# Patient Record
Sex: Female | Born: 1965
Health system: Southern US, Community
[De-identification: ages and names within clinical notes are randomized; demographics above are authoritative.]

## PROBLEM LIST (undated history)

## (undated) DIAGNOSIS — L570 Actinic keratosis: Secondary | ICD-10-CM

## (undated) DIAGNOSIS — K589 Irritable bowel syndrome without diarrhea: Secondary | ICD-10-CM

## (undated) DIAGNOSIS — E079 Disorder of thyroid, unspecified: Secondary | ICD-10-CM

## (undated) DIAGNOSIS — Z90710 Acquired absence of both cervix and uterus: Secondary | ICD-10-CM

## (undated) DIAGNOSIS — K5730 Diverticulosis of large intestine without perforation or abscess without bleeding: Secondary | ICD-10-CM

## (undated) DIAGNOSIS — R87613 High grade squamous intraepithelial lesion on cytologic smear of cervix (HGSIL): Secondary | ICD-10-CM

## (undated) HISTORY — DX: Acquired absence of both cervix and uterus: Z90.710

## (undated) HISTORY — DX: Diverticulosis of large intestine without perforation or abscess without bleeding: K57.30

## (undated) HISTORY — DX: Irritable bowel syndrome, unspecified: K58.9

## (undated) HISTORY — DX: High grade squamous intraepithelial lesion on cytologic smear of cervix (HGSIL): R87.613

---
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
## 1993-10-12 HISTORY — PX: APPENDECTOMY: SHX54

## 2000-07-04 HISTORY — PX: TOTAL VAGINAL HYSTERECTOMY: SHX2548

## 2001-07-04 DIAGNOSIS — R87613 High grade squamous intraepithelial lesion on cytologic smear of cervix (HGSIL): Secondary | ICD-10-CM

## 2001-07-04 HISTORY — PX: BREAST SURGERY: SHX581

## 2001-07-04 HISTORY — DX: High grade squamous intraepithelial lesion on cytologic smear of cervix (HGSIL): R87.613

## 2002-03-26 HISTORY — PX: CERVICAL BIOPSY  W/ LOOP ELECTRODE EXCISION: SUR135

## 2002-07-04 DIAGNOSIS — Z90710 Acquired absence of both cervix and uterus: Secondary | ICD-10-CM

## 2002-07-04 HISTORY — DX: Acquired absence of both cervix and uterus: Z90.710

## 2004-07-09 ENCOUNTER — Ambulatory Visit: Payer: Self-pay

## 2006-04-25 ENCOUNTER — Ambulatory Visit: Payer: Self-pay

## 2007-08-15 ENCOUNTER — Ambulatory Visit: Payer: Self-pay | Admitting: General Practice

## 2008-08-19 ENCOUNTER — Ambulatory Visit: Payer: Self-pay

## 2009-08-20 ENCOUNTER — Ambulatory Visit: Payer: Self-pay

## 2010-07-16 ENCOUNTER — Ambulatory Visit: Payer: Self-pay | Admitting: General Practice

## 2010-07-26 ENCOUNTER — Ambulatory Visit: Payer: Self-pay | Admitting: General Practice

## 2010-08-24 ENCOUNTER — Ambulatory Visit: Payer: Self-pay

## 2011-09-07 ENCOUNTER — Ambulatory Visit: Payer: Self-pay | Admitting: General Practice

## 2011-09-12 ENCOUNTER — Ambulatory Visit: Payer: Self-pay | Admitting: General Practice

## 2012-09-12 ENCOUNTER — Ambulatory Visit: Payer: Self-pay

## 2012-09-12 LAB — HM MAMMOGRAPHY

## 2013-07-04 DIAGNOSIS — K5730 Diverticulosis of large intestine without perforation or abscess without bleeding: Secondary | ICD-10-CM

## 2013-07-04 HISTORY — DX: Diverticulosis of large intestine without perforation or abscess without bleeding: K57.30

## 2013-07-04 HISTORY — PX: COLONOSCOPY: SHX174

## 2013-09-17 ENCOUNTER — Ambulatory Visit: Payer: Self-pay | Admitting: Gastroenterology

## 2013-09-17 DIAGNOSIS — K8020 Calculus of gallbladder without cholecystitis without obstruction: Secondary | ICD-10-CM | POA: Insufficient documentation

## 2013-10-07 ENCOUNTER — Ambulatory Visit (INDEPENDENT_AMBULATORY_CARE_PROVIDER_SITE_OTHER): Payer: BC Managed Care – PPO | Admitting: General Surgery

## 2013-10-07 ENCOUNTER — Encounter: Payer: Self-pay | Admitting: General Surgery

## 2013-10-07 VITALS — BP 118/80 | HR 68 | Resp 14 | Ht 62.5 in | Wt 120.0 lb

## 2013-10-07 DIAGNOSIS — K8020 Calculus of gallbladder without cholecystitis without obstruction: Secondary | ICD-10-CM

## 2013-10-07 DIAGNOSIS — R1032 Left lower quadrant pain: Secondary | ICD-10-CM

## 2013-10-07 NOTE — Progress Notes (Signed)
Patient ID: Tiffany Miller, female   DOB: [birth year], 48 y.o.   MRN: [id number]  Chief Complaint  Patient presents with  . Other    evaluation of gallbladder    HPI Tiffany Miller is a 48 y.o. female who presents for an evaluation of her gallbladder. The patient states she started having problems with her gallbladder around Thanksgiving. She complains of constipation and diarrhea, nausea, and low abdominal cramping. She has had 2-3 severe episodes since Thanksgiving. She hasn't had any episodes in the last 2 weeks. She states that she has identified salads to be the triggering event on 2 occasions of her left lower quadrant pain. She does have a history of IBS as well, diagnosed by her family physician at age 89 when she reported intermittent abdominal pain and waxing/waning constipation/diarrhea. Barium enema at that time was reported to be negative. The patient reports no dietary intolerance resulting in upper abdominal symptoms, right upper quadrant/right shoulder/epigastric pain. No intolerance to fatty foods. Nausea has been a component on rare occasions when the pain in the lower abdomen has been severe.  The patient had an abdominal ultrasound on 09/17/13. The patient had a colonoscopy with Dr. Rayann Heman last week which time multiple diverticuli were reported.   The patient was last seen here in March 2013 in regards to a questionable mammogram. She has continued to have annual exams through the city [employer].  HPI  Past Medical History  Diagnosis Date  . IBS (irritable bowel syndrome)   . Diverticula, colon 2015    Past Surgical History  Procedure Laterality Date  . Tonsillectomy  1975  . Appendectomy  1995  . Breast surgery Bilateral 2003    augmentation  . Abdominal hysterectomy    . Colonoscopy  2015    Dr. Rayann Heman    Family History  Problem Relation Age of Onset  . Cancer Father     unknown  . Cancer Maternal Grandmother     breast    Social History History   Substance Use Topics  . Smoking status: Never Smoker   . Smokeless tobacco: Never Used  . Alcohol Use: No    Allergies  Allergen Reactions  . Sulfa Antibiotics Hives and Rash    Current Outpatient Prescriptions  Medication Sig Dispense Refill  . hyoscyamine (LEVBID) 0.375 MG 12 hr tablet Take 1 tablet by mouth as needed.       No current facility-administered medications for this visit.    Review of Systems Review of Systems  Constitutional: Negative.   Respiratory: Negative.   Cardiovascular: Negative.   Gastrointestinal: Positive for nausea, abdominal pain and constipation.    Blood pressure 118/80, pulse 68, resp. rate 14, height 5' 2.5" (1.588 m), weight 120 lb (54.432 kg).  Physical Exam Physical Exam  Constitutional: She is oriented to person, place, and time. She appears well-developed and well-nourished.  Neck: Neck supple. No thyromegaly present.  Cardiovascular: Normal rate, regular rhythm and normal heart sounds.   No murmur heard. Pulmonary/Chest: Effort normal and breath sounds normal.  Abdominal: Soft. Normal appearance and bowel sounds are normal. There is no hepatosplenomegaly. There is no tenderness. No hernia.  Lymphadenopathy:    She has no cervical adenopathy.  Neurological: She is alert and oriented to person, place, and time.  Skin: Skin is warm and dry.    Data Reviewed Abdominal ultrasounds from January 2012 and March 2015 were reviewed. Interval development of a few small 2 mm mobile lesions thought to represent  gallstones. No evidence of gallbladder wall thickening, pericholecystic fluid or Murphy sign. No additional intra-abdominal pathology reported.  Assessment    Intermittent abdominal pain, left lower quadrant. Unlikely correlation with recently identified gallstones.    Plan    It was not possible to reviewed the colonoscopy report, as numbness walk into the hospital system. The patient's reports of left lower quadrant pain as well  as a long history of waxing and waning bowel habits would speak more to IBS/diverticular source for her pain. I think this will likely to do expect resolution of her abdominal symptoms with elective cholecystectomy based on her present history and exam.  High fiber diet and adequate fluid intake to allow soft lamination was encouraged. Should she develop upper abdominal symptoms we can reassess the appropriateness of elective cholecystectomy.    PCP: Nelda Severe. Burt Ek MD Ref. MD: Arther Dames MD   Robert Bellow 10/08/2013, 8:27 AM

## 2013-10-07 NOTE — Patient Instructions (Addendum)
Patient to return as needed. The patient is aware to call back for any questions or concerns. Patient advised to add a fiber supplement to her diet.

## 2013-10-08 DIAGNOSIS — R1032 Left lower quadrant pain: Secondary | ICD-10-CM | POA: Insufficient documentation

## 2014-02-10 ENCOUNTER — Ambulatory Visit: Payer: Self-pay | Admitting: General Practice

## 2014-04-22 ENCOUNTER — Ambulatory Visit: Payer: Self-pay | Admitting: General Practice

## 2014-05-05 ENCOUNTER — Encounter: Payer: Self-pay | Admitting: General Surgery

## 2014-12-24 ENCOUNTER — Other Ambulatory Visit: Payer: Self-pay | Admitting: Physician Assistant

## 2014-12-24 DIAGNOSIS — E0590 Thyrotoxicosis, unspecified without thyrotoxic crisis or storm: Secondary | ICD-10-CM

## 2014-12-26 ENCOUNTER — Ambulatory Visit
Admission: RE | Admit: 2014-12-26 | Discharge: 2014-12-26 | Disposition: A | Discharge status: Home / Self Care | Payer: BLUE CROSS/BLUE SHIELD | Source: Ambulatory Visit | Attending: Physician Assistant | Admitting: Physician Assistant

## 2014-12-26 DIAGNOSIS — E0590 Thyrotoxicosis, unspecified without thyrotoxic crisis or storm: Secondary | ICD-10-CM | POA: Diagnosis present

## 2014-12-26 DIAGNOSIS — E041 Nontoxic single thyroid nodule: Secondary | ICD-10-CM | POA: Diagnosis not present

## 2015-02-05 IMAGING — US ABDOMEN ULTRASOUND
1 series · 14 of 25 positions shown · non-contrast
Comparison: July 26, 2010

CLINICAL DATA: Abdominal pain

EXAM:
ULTRASOUND ABDOMEN COMPLETE

[Series 1: abdomen ultrasound · 0.20mm/px · 14 of 98 slices shown]
[im 1/98]
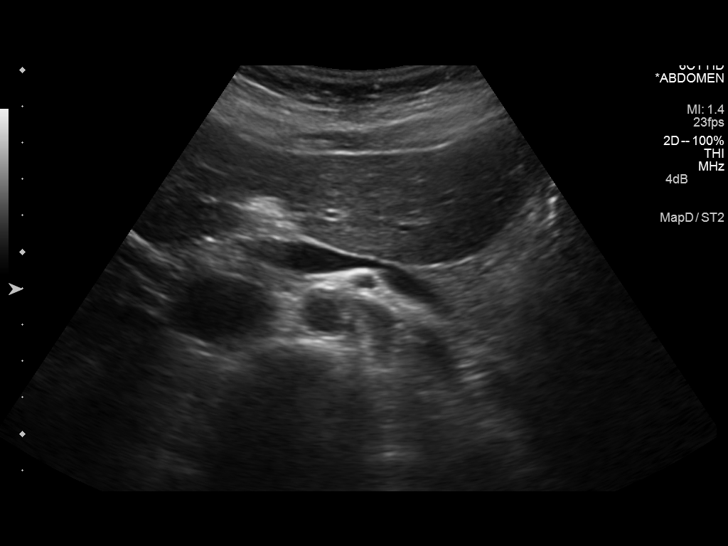
[im 9/98]
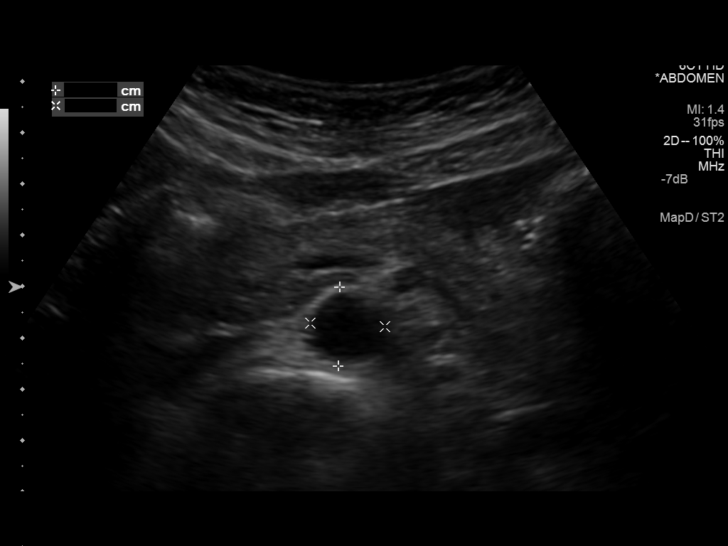
[im 17/98]
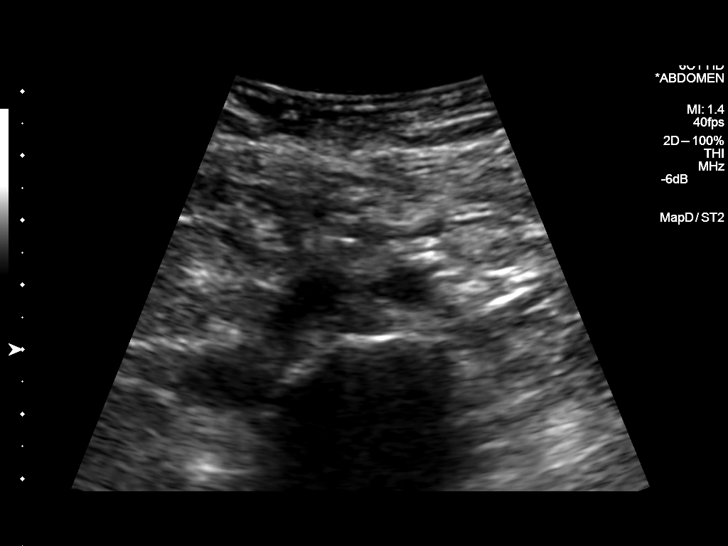
[im 25/98]
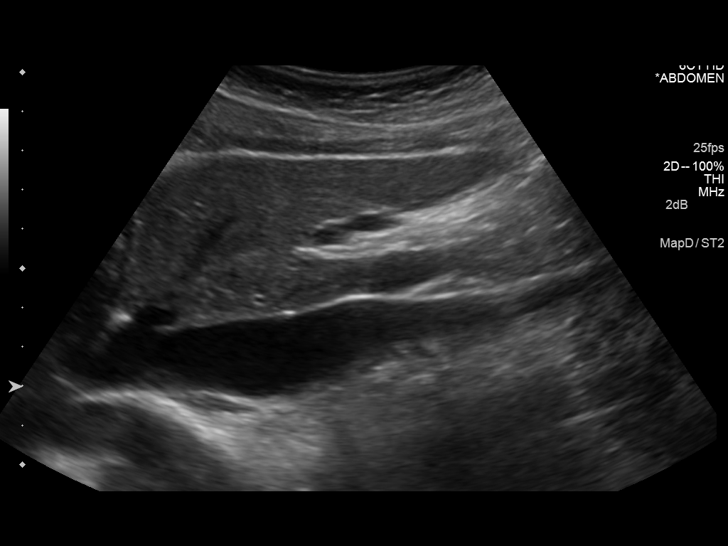
[im 33/98]
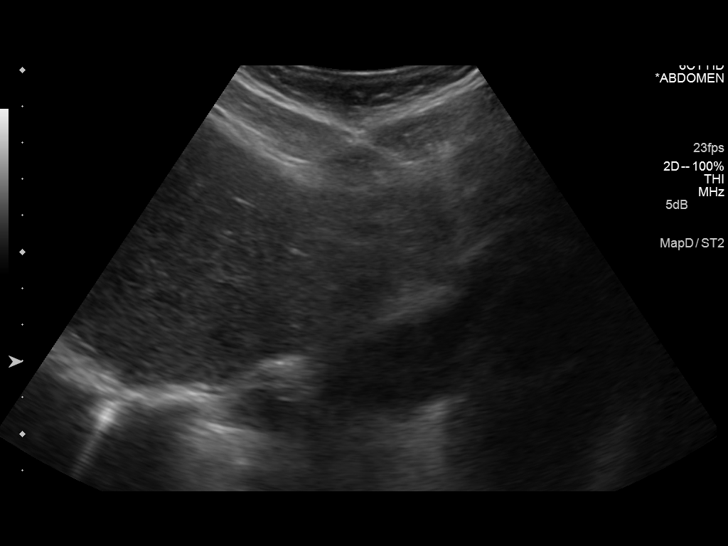
[im 37/98]
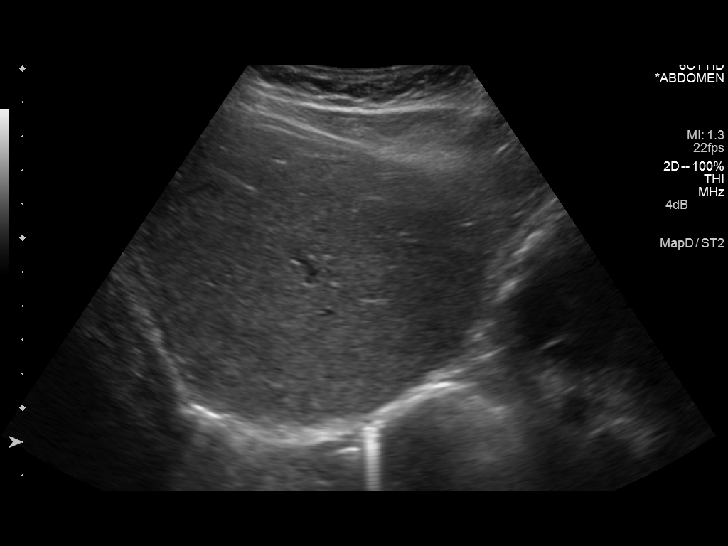
[im 45/98]
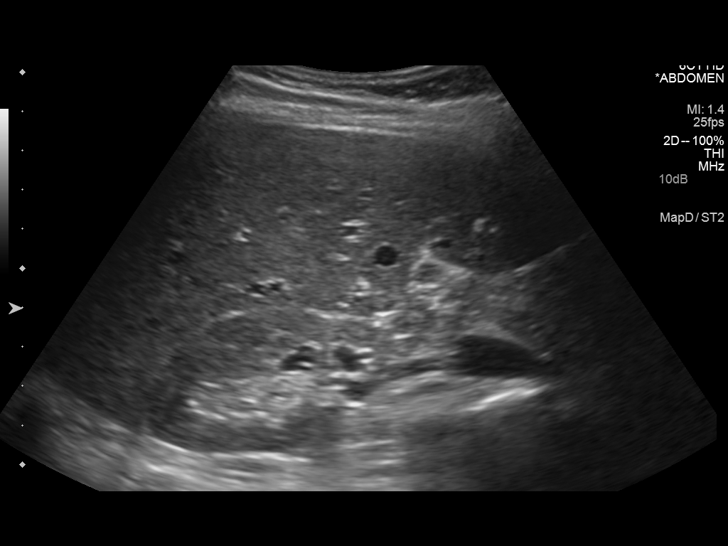
[im 53/98]
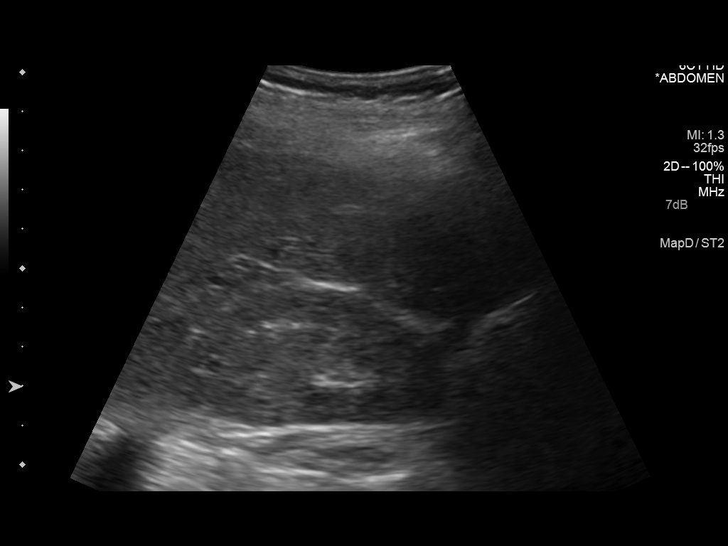
[im 61/98]
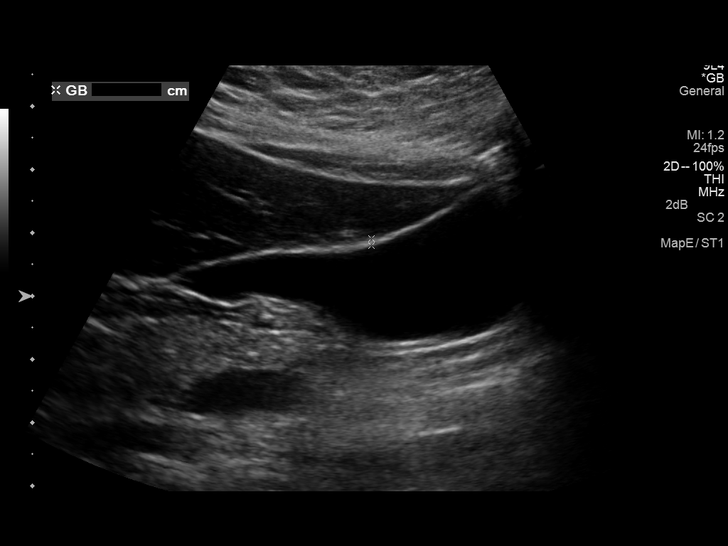
[im 65/98]
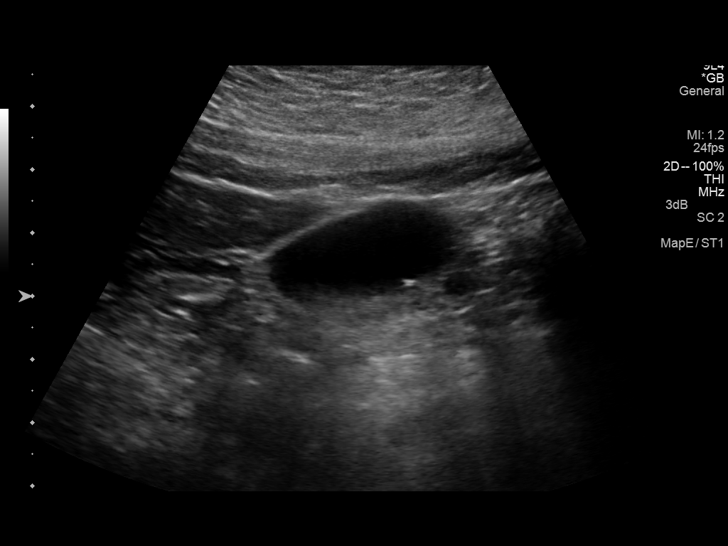
[im 73/98]
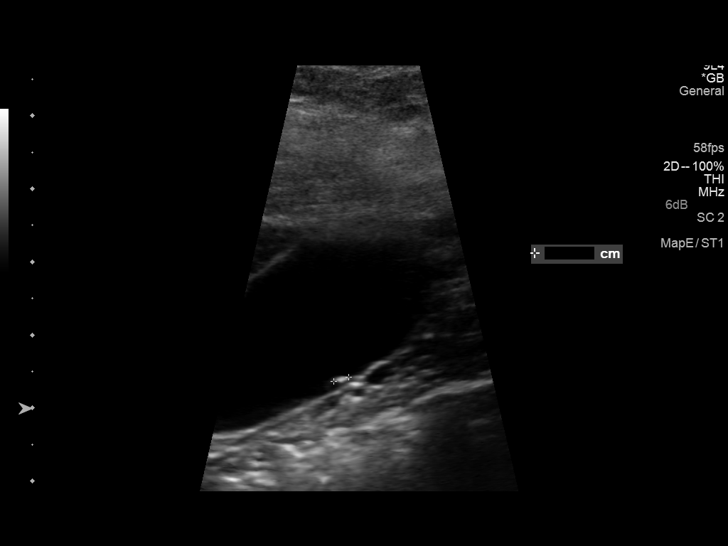
[im 81/98]
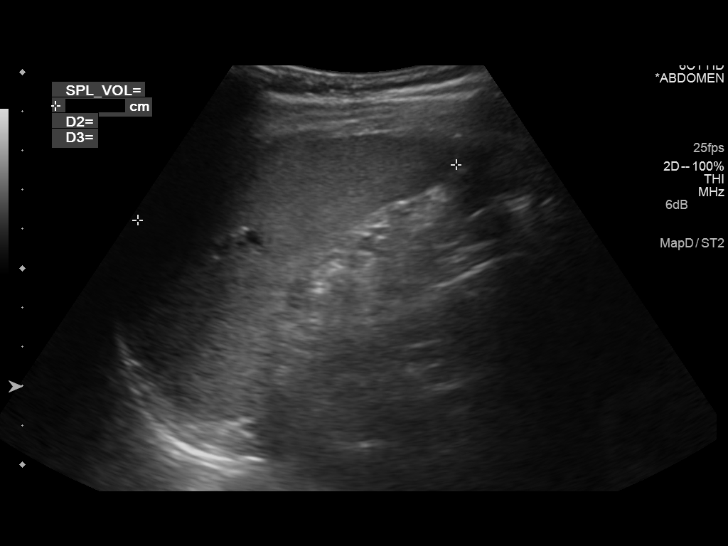
[im 89/98]
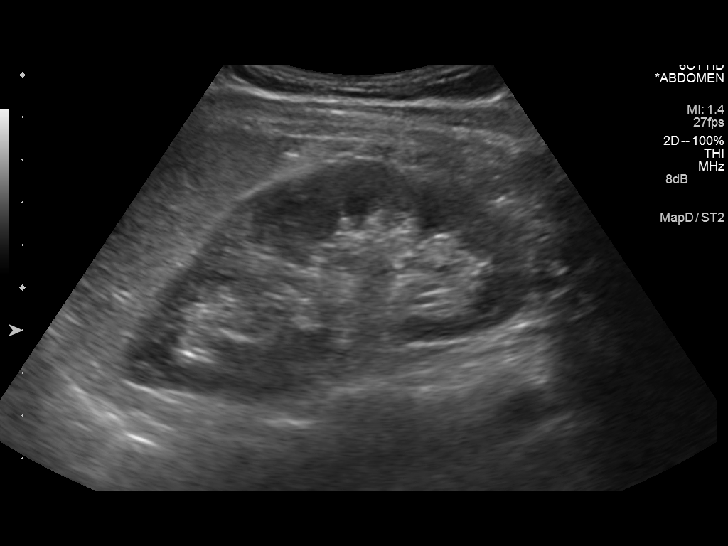
[im 98/98]
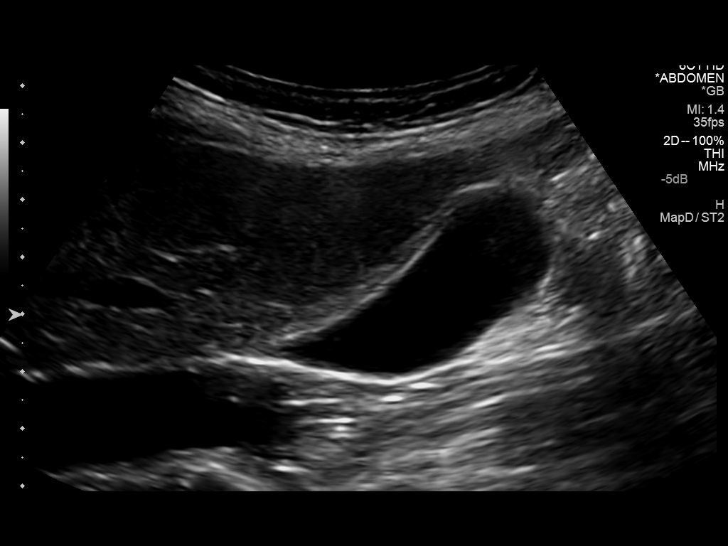

[14 of 25 positions shown; findings below may reference images not displayed]

FINDINGS: Gallbladder:

Within the gallbladder, there are several small echogenic foci which
move and shadow consistent with gallstones. There is no gallbladder
wall thickening or pericholecystic fluid. No sonographic Murphy sign
noted.

Common bile duct:

Diameter: 2 mm. There is no intrahepatic, common hepatic, or common
bile duct dilatation.

Liver:

No focal lesion identified. Within normal limits in parenchymal
echogenicity.

IVC:

No abnormality visualized.

Pancreas:

Visualized portion unremarkable. Portions of pancreas are obscured
by gas.

Spleen:

Size and appearance within normal limits.

Right Kidney:

Length: 9.5 cm. Echogenicity within normal limits. No mass or
hydronephrosis visualized.

Left Kidney:

Length: 10.1 cm. Echogenicity within normal limits. No mass or
hydronephrosis visualized.

Abdominal aorta:

No aneurysm visualized.

Other findings:

No demonstrable ascites.
IMPRESSION: Cholelithiasis. Portions of pancreas obscured by gas. Visualized
portions of pancreas appear normal. Study otherwise unremarkable.

## 2015-03-02 LAB — HM PAP SMEAR

## 2015-05-20 ENCOUNTER — Encounter: Payer: Self-pay | Admitting: Emergency Medicine

## 2015-05-20 ENCOUNTER — Emergency Department: Payer: BLUE CROSS/BLUE SHIELD

## 2015-05-20 ENCOUNTER — Emergency Department
Admission: EM | Admit: 2015-05-20 | Discharge: 2015-05-20 | Disposition: A | Discharge status: Home / Self Care | Payer: BLUE CROSS/BLUE SHIELD | Attending: Emergency Medicine | Admitting: Emergency Medicine

## 2015-05-20 DIAGNOSIS — E039 Hypothyroidism, unspecified: Secondary | ICD-10-CM | POA: Diagnosis not present

## 2015-05-20 DIAGNOSIS — X58XXXA Exposure to other specified factors, initial encounter: Secondary | ICD-10-CM | POA: Diagnosis not present

## 2015-05-20 DIAGNOSIS — S3992XA Unspecified injury of lower back, initial encounter: Secondary | ICD-10-CM | POA: Diagnosis present

## 2015-05-20 DIAGNOSIS — Z79899 Other long term (current) drug therapy: Secondary | ICD-10-CM | POA: Diagnosis not present

## 2015-05-20 DIAGNOSIS — Y998 Other external cause status: Secondary | ICD-10-CM | POA: Diagnosis not present

## 2015-05-20 DIAGNOSIS — Y9289 Other specified places as the place of occurrence of the external cause: Secondary | ICD-10-CM | POA: Insufficient documentation

## 2015-05-20 DIAGNOSIS — Y93B9 Activity, other involving muscle strengthening exercises: Secondary | ICD-10-CM | POA: Insufficient documentation

## 2015-05-20 DIAGNOSIS — S39012A Strain of muscle, fascia and tendon of lower back, initial encounter: Secondary | ICD-10-CM | POA: Insufficient documentation

## 2015-05-20 DIAGNOSIS — K5901 Slow transit constipation: Secondary | ICD-10-CM | POA: Insufficient documentation

## 2015-05-20 HISTORY — DX: Disorder of thyroid, unspecified: E07.9

## 2015-05-20 MED ORDER — METHOCARBAMOL 750 MG PO TABS
750.0000 mg | ORAL_TABLET | Freq: Four times a day (QID) | ORAL | Status: DC
Start: 2015-05-20 — End: 2017-01-11

## 2015-05-20 MED ORDER — HYDROMORPHONE HCL 1 MG/ML IJ SOLN
1.0000 mg | Freq: Once | INTRAMUSCULAR | Status: AC
  Administered 2015-05-20: 1 mg via INTRAMUSCULAR
  Filled 2015-05-20: qty 1

## 2015-05-20 MED ORDER — ORPHENADRINE CITRATE 30 MG/ML IJ SOLN
60.0000 mg | Freq: Two times a day (BID) | INTRAMUSCULAR | Status: DC
  Administered 2015-05-20: 60 mg via INTRAMUSCULAR
  Filled 2015-05-20: qty 2

## 2015-05-20 MED ORDER — OXYCODONE-ACETAMINOPHEN 7.5-325 MG PO TABS: 1.0000 | ORAL_TABLET | ORAL | Status: DC | PRN

## 2015-05-20 MED ORDER — HYDROCODONE-ACETAMINOPHEN 7.5-300 MG PO TABS: 1.0000 | ORAL_TABLET | Freq: Four times a day (QID) | ORAL | Status: DC

## 2015-05-20 MED ORDER — METHOCARBAMOL 750 MG PO TABS: 1500.0000 mg | ORAL_TABLET | Freq: Four times a day (QID) | ORAL | Status: DC

## 2015-05-20 NOTE — ED Notes (Signed)
Pt to ER with c/o low back pain (just above tailbone).  Denies known injury, denies urinary symptoms.  States went to gym yesterday and started about 2 hours after that. States not first time at gym.  States pain with standing, sitting and movement.

## 2015-05-20 NOTE — ED Notes (Signed)
Pt states last night she had sudden onset of lower back pain, denies any injury or previous back pain hx, pt unable to ambulate without assistance, states pain when legs are raised, not tender to palpation

## 2015-05-20 NOTE — ED Provider Notes (Signed)
[facility] Emergency Department Provider Note  ____________________________________________  Time seen: Approximately 4:39 PM  I have reviewed the triage vital signs and the nursing notes.   HISTORY  Chief Complaint Back Pain    HPI Tiffany Miller is a 49 y.o. female patient complaining of onset of acute low back pain just above her tailbone started last night. Patient state she went to the gym yesterday and about 2 hours after exercising she started experiencing low back pain and coccyx area. Patient state the pain increases with standing and sitting. She denies any radicular component to this pain. Patient denies any bladder or bowel dysfunction. No palliative measures taken for this complaint. Patient rates the pain as a 10 over 10 described as sharp. Patient has no prior history of back complaints.   Past Medical History  Diagnosis Date  . IBS (irritable bowel syndrome)   . Diverticula, colon 2015  . Thyroid disease     Patient Active Problem List   Diagnosis Date Noted  . Abdominal pain, left lower quadrant 10/08/2013  . Gallstones 09/17/2013    Past Surgical History  Procedure Laterality Date  . Tonsillectomy  1975  . Appendectomy  1995  . Breast surgery Bilateral 2003    augmentation  . Abdominal hysterectomy    . Colonoscopy  2015    Dr. Rayann Heman    Current Outpatient Rx  Name  Route  Sig  Dispense  Refill  . levothyroxine (SYNTHROID, LEVOTHROID) 50 MCG tablet   Oral   Take 50 mcg by mouth daily before breakfast.         . hyoscyamine (LEVBID) 0.375 MG 12 hr tablet   Oral   Take 1 tablet by mouth as needed.         . methocarbamol (ROBAXIN-750) 750 MG tablet   Oral   Take 2 tablets (1,500 mg total) by mouth 4 (four) times daily.   40 tablet   0   . oxyCODONE-acetaminophen (PERCOCET) 7.5-325 MG tablet   Oral   Take 1 tablet by mouth every 4 (four) hours as needed for severe pain.   20 tablet   0      Allergies Sulfa antibiotics  Family History  Problem Relation Age of Onset  . Cancer Father     unknown  . Cancer Maternal Grandmother     breast    Social History Social History  Substance Use Topics  . Smoking status: Never Smoker   . Smokeless tobacco: Never Used  . Alcohol Use: No    Review of Systems Constitutional: No fever/chills Eyes: No visual changes. ENT: No sore throat. Cardiovascular: Denies chest pain. Respiratory: Denies shortness of breath. Gastrointestinal: No abdominal pain.  No nausea, no vomiting.  No diarrhea.  No constipation. Genitourinary: Negative for dysuria. Musculoskeletal: Positive for back pain. Skin: Negative for rash. Neurological: Negative for headaches, focal weakness or numbness. Endocrine:Hypothyroidism Allergic/Immunilogical: Sulfa antibiotics  10-point ROS otherwise negative.  ____________________________________________   PHYSICAL EXAM:  VITAL SIGNS: ED Triage Vitals  Enc Vitals Group     BP 05/20/15 1610 108/49 mmHg     Pulse Rate 05/20/15 1610 65     Resp 05/20/15 1610 18     Temp 05/20/15 1610 98.3 F (36.8 C)     Temp Source 05/20/15 1610 Oral     SpO2 05/20/15 1610 100 %     Weight 05/20/15 1610 124 lb (56.246 kg)     Height 05/20/15 1610 5\' 2"  (1.575 m)  Head Cir --      Peak Flow --      Pain Score 05/20/15 1615 10     Pain Loc --      Pain Edu? --      Excl. in [place]? --     Constitutional: Alert and oriented. Well appearing and in no acute distress. Eyes: Conjunctivae are normal. PERRL. EOMI. Head: Atraumatic. Nose: No congestion/rhinnorhea. Mouth/Throat: Mucous membranes are moist.  Oropharynx non-erythematous. Neck: No stridor.  No cervical spine tenderness to palpation. Hematological/Lymphatic/Immunilogical: No cervical lymphadenopathy. Cardiovascular: Normal rate, regular rhythm. Grossly normal heart sounds.  Good peripheral circulation. Respiratory: Normal respiratory effort.  No  retractions. Lungs CTAB. Gastrointestinal: Soft and nontender. No distention. No abdominal bruits. No CVA tenderness. Musculoskeletal: No spinal deformity. Patient sits and stands with assistance. Patient decreased range of motion with extension. There was no guarding palpation spinal processes. Patient straight leg raise tests positive for left lower extremity.  Neurologic:  Normal speech and language. No gross focal neurologic deficits are appreciated. No gait instability. Skin:  Skin is warm, dry and intact. No rash noted. Psychiatric: Mood and affect are normal. Speech and behavior are normal.  ____________________________________________   LABS (all labs ordered are listed, but only abnormal results are displayed)  Labs Reviewed - No data to display ____________________________________________  EKG   ____________________________________________  RADIOLOGY  No acute findings of lumbar x-ray. Incidental finding of heavy stool burden throughout the colon. ____________________________________________   PROCEDURES  Procedure(s) performed: None  Critical Care performed: No  ____________________________________________   INITIAL IMPRESSION / ASSESSMENT AND PLAN / ED COURSE  Pertinent labs & imaging results that were available during my care of the patient were reviewed by me and considered in my medical decision making (see chart for details). Acute lumbar strain. Discussed x-ray findings with patient. Patient given a prescription for Percocet and Robaxin. Patient given discharge care instructions and a work note for 2 days. Patient advised to follow-up with Kennon Rounds for reevaluation before returning to work. ____________________________________________   FINAL CLINICAL IMPRESSION(S) / ED DIAGNOSES  Final diagnoses:  Lumbar strain, initial encounter  Constipation by delayed colonic transit      Sable Feil, PA-C 05/20/15 [place],  MD 05/20/15 2049

## 2015-10-14 DIAGNOSIS — Z9882 Breast implant status: Secondary | ICD-10-CM | POA: Diagnosis not present

## 2015-10-14 DIAGNOSIS — N649 Disorder of breast, unspecified: Secondary | ICD-10-CM | POA: Diagnosis not present

## 2015-12-28 DIAGNOSIS — L812 Freckles: Secondary | ICD-10-CM | POA: Diagnosis not present

## 2015-12-28 DIAGNOSIS — D229 Melanocytic nevi, unspecified: Secondary | ICD-10-CM | POA: Diagnosis not present

## 2015-12-28 DIAGNOSIS — L570 Actinic keratosis: Secondary | ICD-10-CM | POA: Diagnosis not present

## 2015-12-28 DIAGNOSIS — L578 Other skin changes due to chronic exposure to nonionizing radiation: Secondary | ICD-10-CM | POA: Diagnosis not present

## 2015-12-28 DIAGNOSIS — D485 Neoplasm of uncertain behavior of skin: Secondary | ICD-10-CM | POA: Diagnosis not present

## 2015-12-28 DIAGNOSIS — Z1283 Encounter for screening for malignant neoplasm of skin: Secondary | ICD-10-CM | POA: Diagnosis not present

## 2015-12-28 HISTORY — DX: Actinic keratosis: L57.0

## 2016-01-26 DIAGNOSIS — L570 Actinic keratosis: Secondary | ICD-10-CM | POA: Diagnosis not present

## 2016-01-26 DIAGNOSIS — L819 Disorder of pigmentation, unspecified: Secondary | ICD-10-CM | POA: Diagnosis not present

## 2016-01-26 DIAGNOSIS — L821 Other seborrheic keratosis: Secondary | ICD-10-CM | POA: Diagnosis not present

## 2016-01-26 DIAGNOSIS — L812 Freckles: Secondary | ICD-10-CM | POA: Diagnosis not present

## 2016-01-26 DIAGNOSIS — L99 Other disorders of skin and subcutaneous tissue in diseases classified elsewhere: Secondary | ICD-10-CM | POA: Diagnosis not present

## 2016-05-04 DIAGNOSIS — E041 Nontoxic single thyroid nodule: Secondary | ICD-10-CM | POA: Diagnosis not present

## 2016-05-04 DIAGNOSIS — M542 Cervicalgia: Secondary | ICD-10-CM | POA: Diagnosis not present

## 2016-05-04 DIAGNOSIS — R1312 Dysphagia, oropharyngeal phase: Secondary | ICD-10-CM | POA: Diagnosis not present

## 2016-05-04 DIAGNOSIS — E063 Autoimmune thyroiditis: Secondary | ICD-10-CM | POA: Diagnosis not present

## 2016-05-30 DIAGNOSIS — R490 Dysphonia: Secondary | ICD-10-CM | POA: Diagnosis not present

## 2016-05-30 DIAGNOSIS — R1314 Dysphagia, pharyngoesophageal phase: Secondary | ICD-10-CM | POA: Diagnosis not present

## 2016-05-30 DIAGNOSIS — F410 Panic disorder [episodic paroxysmal anxiety] without agoraphobia: Secondary | ICD-10-CM | POA: Diagnosis not present

## 2016-06-03 DIAGNOSIS — F411 Generalized anxiety disorder: Secondary | ICD-10-CM | POA: Diagnosis not present

## 2016-06-14 DIAGNOSIS — F411 Generalized anxiety disorder: Secondary | ICD-10-CM | POA: Diagnosis not present

## 2016-06-22 DIAGNOSIS — F411 Generalized anxiety disorder: Secondary | ICD-10-CM | POA: Diagnosis not present

## 2016-10-07 IMAGING — CR DG LUMBAR SPINE COMPLETE 4+V
1 series · 5 of 5 positions shown · non-contrast
Comparison: None.

CLINICAL DATA: Acute onset low back pain without known injury.
Initial encounter.

EXAM:
LUMBAR SPINE - COMPLETE 4+ VIEW

[Series 1: dg lumbar spine complete 4 +v · 0.14mm/px · 5 of 5 slices shown]
[im 1/5]
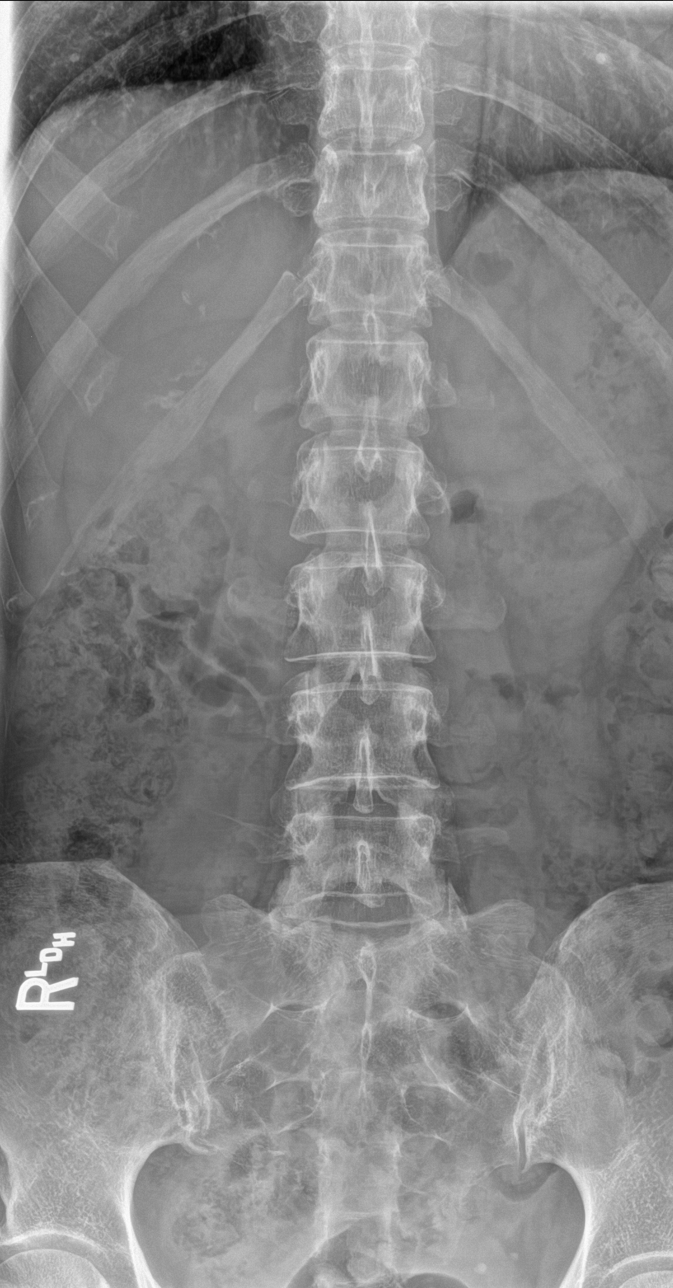
[im 2/5]
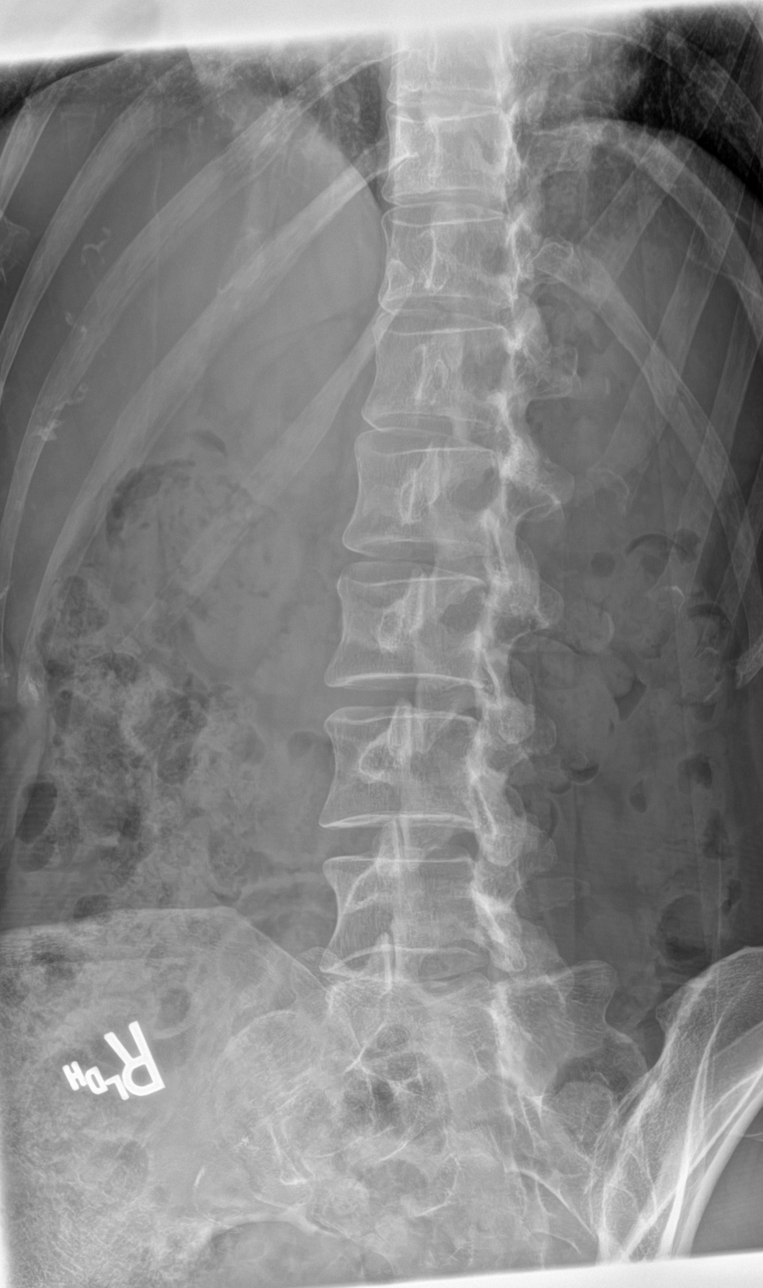
[im 3/5]
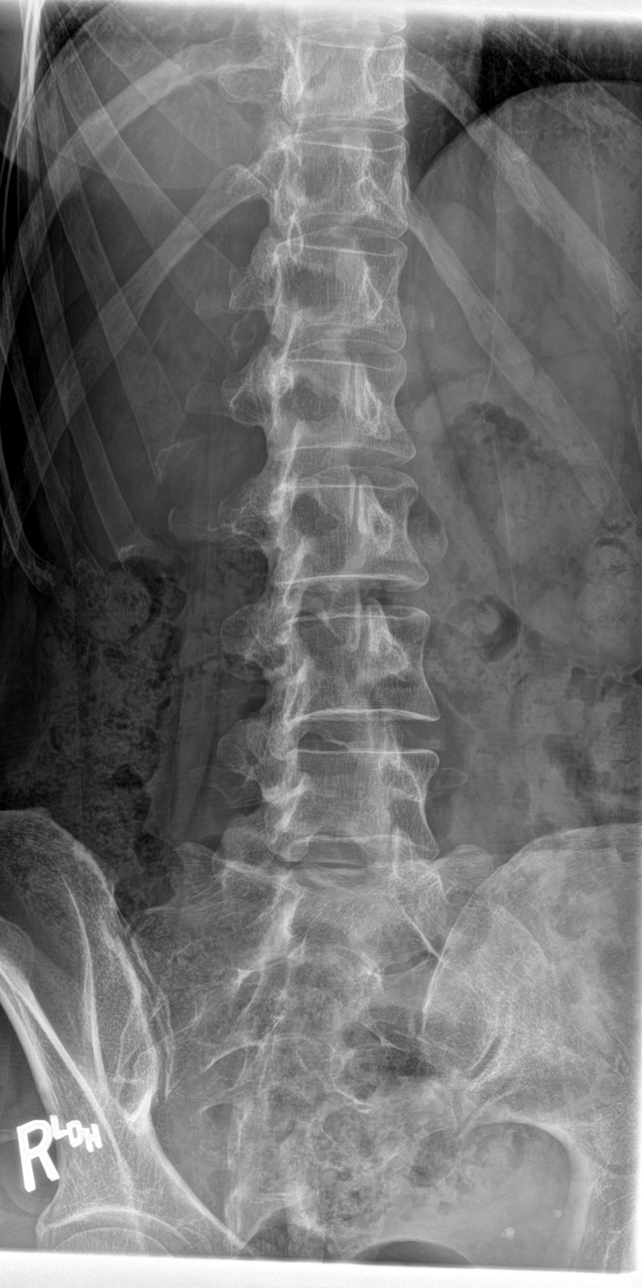
[im 4/5]
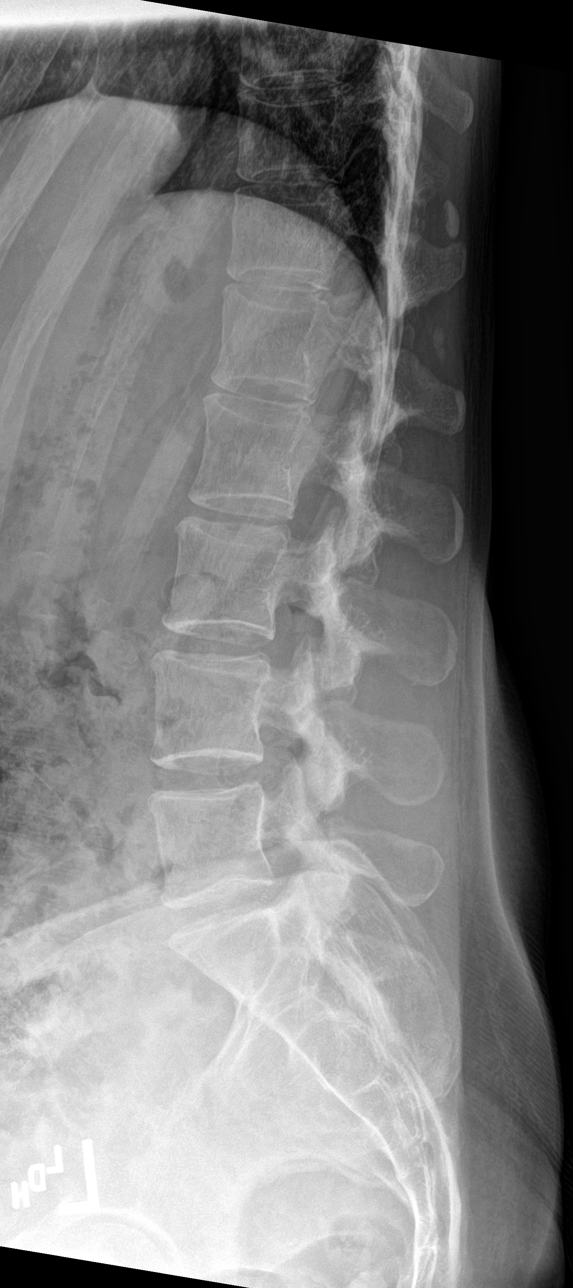
[im 5/5]
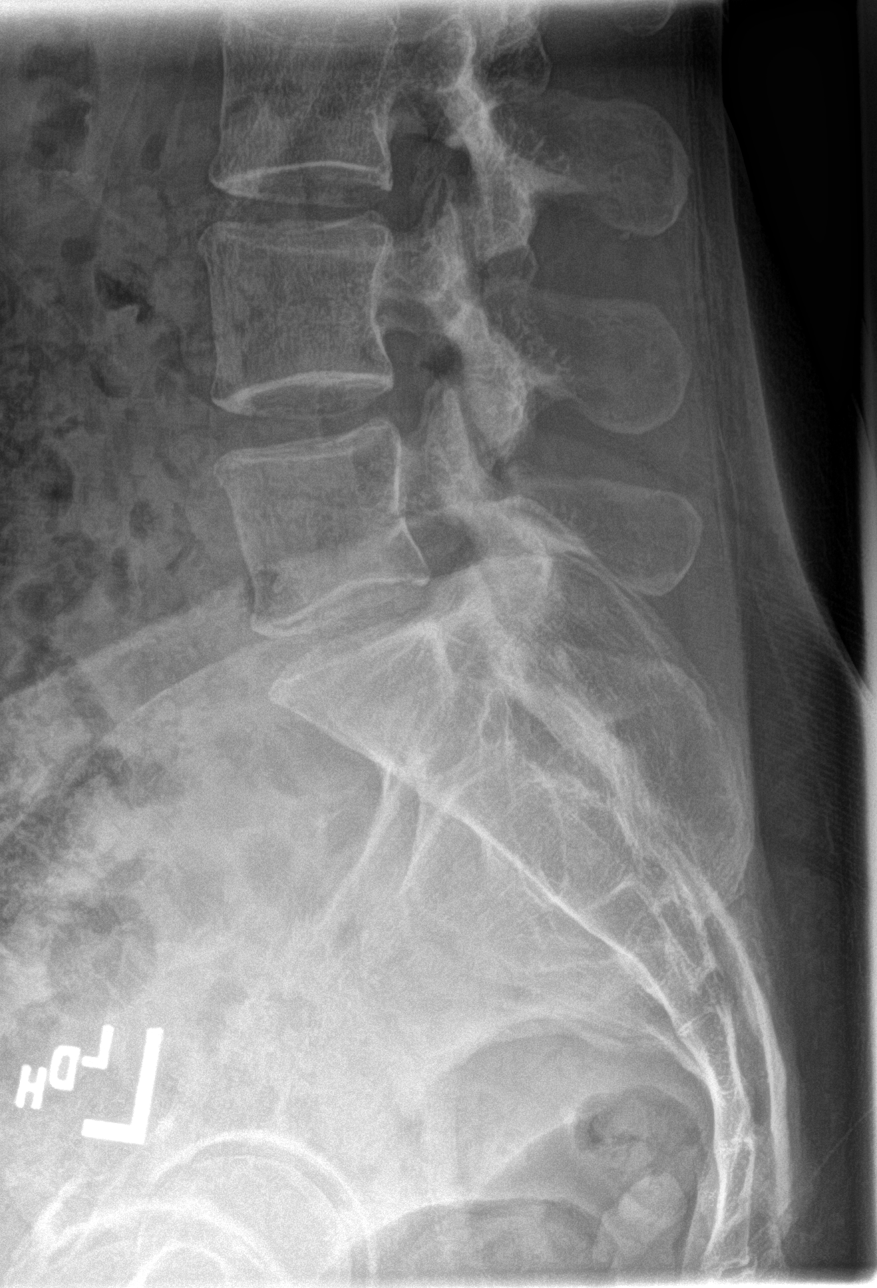

[5 of 5 positions shown; findings below may reference images not displayed]

FINDINGS: There is no evidence of lumbar spine fracture. Alignment is normal.
Intervertebral disc spaces are maintained. Large stool burden
throughout the colon is noted.
IMPRESSION: Normal appearing lumbar spine.

Large colonic stool burden.

## 2016-10-14 DIAGNOSIS — Z1231 Encounter for screening mammogram for malignant neoplasm of breast: Secondary | ICD-10-CM | POA: Diagnosis not present

## 2016-10-14 DIAGNOSIS — Z9882 Breast implant status: Secondary | ICD-10-CM | POA: Diagnosis not present

## 2016-11-22 DIAGNOSIS — H524 Presbyopia: Secondary | ICD-10-CM | POA: Diagnosis not present

## 2017-01-09 DIAGNOSIS — L821 Other seborrheic keratosis: Secondary | ICD-10-CM | POA: Diagnosis not present

## 2017-01-09 DIAGNOSIS — L812 Freckles: Secondary | ICD-10-CM | POA: Diagnosis not present

## 2017-01-09 DIAGNOSIS — Z1283 Encounter for screening for malignant neoplasm of skin: Secondary | ICD-10-CM | POA: Diagnosis not present

## 2017-01-09 DIAGNOSIS — D2371 Other benign neoplasm of skin of right lower limb, including hip: Secondary | ICD-10-CM | POA: Diagnosis not present

## 2017-01-11 ENCOUNTER — Ambulatory Visit (INDEPENDENT_AMBULATORY_CARE_PROVIDER_SITE_OTHER): Payer: BLUE CROSS/BLUE SHIELD | Admitting: Obstetrics and Gynecology

## 2017-01-11 ENCOUNTER — Encounter: Payer: Self-pay | Admitting: Obstetrics and Gynecology

## 2017-01-11 VITALS — BP 104/58 | HR 63 | Ht 62.5 in | Wt 128.0 lb

## 2017-01-11 DIAGNOSIS — S93402A Sprain of unspecified ligament of left ankle, initial encounter: Secondary | ICD-10-CM | POA: Diagnosis not present

## 2017-01-11 DIAGNOSIS — Z01419 Encounter for gynecological examination (general) (routine) without abnormal findings: Secondary | ICD-10-CM

## 2017-01-11 DIAGNOSIS — Z1151 Encounter for screening for human papillomavirus (HPV): Secondary | ICD-10-CM | POA: Diagnosis not present

## 2017-01-11 DIAGNOSIS — Z1231 Encounter for screening mammogram for malignant neoplasm of breast: Secondary | ICD-10-CM

## 2017-01-11 DIAGNOSIS — Z1239 Encounter for other screening for malignant neoplasm of breast: Secondary | ICD-10-CM

## 2017-01-11 DIAGNOSIS — Z124 Encounter for screening for malignant neoplasm of cervix: Secondary | ICD-10-CM | POA: Diagnosis not present

## 2017-01-11 LAB — HM PAP SMEAR: HM Pap smear: NEGATIVE

## 2017-01-11 NOTE — Progress Notes (Signed)
Chief Complaint  Patient presents with  . Gynecologic Exam     HPI:      Ms. Tiffany Miller is a 51 y.o. [phone] who LMP was No LMP recorded. Patient has had a hysterectomy., presents today for her annual examination.  Her menses are absent due to hyst. She does not have intermenstrual bleeding. She has tolerable vasomotor sx.   Sex activity: single partner, contraception - status post hysterectomy.  Last Pap: March 02, 2015  Results were: no abnormalities /neg HPV DNA  She has a hx of abn paps I the past and is s/p LEEP in the past. Hx of STDs: HPV  Last mammogram: at [place] within last yr There is a FH of breast cancer in her MGM. There is no FH of ovarian cancer. The patient does do self-breast exams.  Tobacco use: The patient denies current or previous tobacco use. Alcohol use: social drinker Exercise: not active  She does not get adequate calcium and Vitamin D in her diet.  She has labs with PCP  Past Medical History:  Diagnosis Date  . Diverticula, colon 2015  . Hx of hysterectomy 2004   d/t abnl pap HGSIL  . IBS (irritable bowel syndrome)   . 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 Thyroid disease     Past Surgical History:  Procedure Laterality Date  . ABDOMINAL HYSTERECTOMY  08/29/2002   [facility], MENORRHAGIA, [facility]  . APPENDECTOMY  10/12/1993   LAP  . BREAST SURGERY Bilateral 2003   augmentation  . CERVICAL BIOPSY  W/ LOOP ELECTRODE EXCISION  03/26/2002   SEVERE DYSP  . COLONOSCOPY  2015   Dr. Rayann Heman  . TONSILLECTOMY  1975    Family History  Problem Relation Age of Onset  . Cancer Father        unknown  . Hypertension Mother   . Diabetes Sister        TYPE 2  . Breast cancer Maternal Grandmother     Social History   Social History  . Marital status: Married    Spouse name: N/A  . Number of children: 2  . Years of education: 12   Occupational History  . Not on file.   Social History Main Topics  . Smoking status: Never  Smoker  . Smokeless tobacco: Never Used  . Alcohol use No     Comment: OCC  . Drug use: No  . Sexual activity: Yes    Birth control/ protection: Surgical   Other Topics Concern  . Not on file   Social History Narrative  . No narrative on file     Current Outpatient Prescriptions:  .  levothyroxine (SYNTHROID, LEVOTHROID) 50 MCG tablet, Take 50 mcg by mouth daily before breakfast., Disp: , Rfl:  .  PARoxetine (PAXIL) 10 MG tablet, Take by mouth., Disp: , Rfl:  .  traZODone (DESYREL) 50 MG tablet, Take by mouth., Disp: , Rfl:   ROS:  Review of Systems  Constitutional: Negative for fatigue, fever and unexpected weight change.  Respiratory: Negative for cough, shortness of breath and wheezing.   Cardiovascular: Negative for chest pain, palpitations and leg swelling.  Gastrointestinal: Negative for blood in stool, constipation, diarrhea, nausea and vomiting.  Endocrine: Negative for cold intolerance, heat intolerance and polyuria.  Genitourinary: Negative for dyspareunia, dysuria, flank pain, frequency, genital sores, hematuria, menstrual problem, pelvic pain, urgency, vaginal bleeding, vaginal discharge and vaginal pain.  Musculoskeletal: Negative for back pain, joint swelling and myalgias.  Skin: Negative for rash.  Neurological: Negative for dizziness, syncope, light-headedness, numbness and headaches.  Hematological: Negative for adenopathy.  Psychiatric/Behavioral: Negative for agitation, confusion, sleep disturbance and suicidal ideas. The patient is not nervous/anxious.      Objective: BP (!) 104/58 (BP Location: Left Arm, Patient Position: Sitting, Cuff Size: Normal)   Pulse 63   Ht 5' 2.5" (1.588 m)   Wt 128 lb (58.1 kg)   BMI 23.04 kg/m    Physical Exam  Constitutional: She is oriented to person, place, and time. She appears well-developed and well-nourished.  Genitourinary: Vagina normal. There is no rash or tenderness on the right labia. There is no rash or  tenderness on the left labia. No erythema or tenderness in the vagina. No vaginal discharge found. Right adnexum does not display mass and does not display tenderness. Left adnexum does not display mass and does not display tenderness.  Genitourinary Comments: UTERUS/CX SURG ABSENT  Neck: Normal range of motion. No thyromegaly present.  Cardiovascular: Normal rate, regular rhythm and normal heart sounds.   No murmur heard. Pulmonary/Chest: Effort normal and breath sounds normal. Right breast exhibits no mass, no nipple discharge, no skin change and no tenderness. Left breast exhibits no mass, no nipple discharge, no skin change and no tenderness.  Abdominal: Soft. There is no tenderness. There is no guarding.  Musculoskeletal: Normal range of motion.  Neurological: She is alert and oriented to person, place, and time. No cranial nerve deficit.  Psychiatric: She has a normal mood and affect. Her behavior is normal.  Vitals reviewed.   Assessment/Plan: Encounter for annual routine gynecological examination  Cervical cancer screening - Plan: IGP, Aptima HPV  Screening for HPV (human papillomavirus) - Plan: IGP, Aptima HPV  Screening for breast cancer - Plan: MM DIGITAL SCREENING BILATERAL             GYN counsel mammography screening, adequate intake of calcium and vitamin D, diet and exercise     F/U  Return in about 1 year (around 01/11/2018).  Aarik Blank B. Jeanni Allshouse, PA-C 01/11/2017 9:02 AM

## 2017-01-14 LAB — IGP, APTIMA HPV
HPV Aptima: NEGATIVE
PAP Smear Comment: 0

## 2017-04-03 DIAGNOSIS — H18231 Secondary corneal edema, right eye: Secondary | ICD-10-CM | POA: Diagnosis not present

## 2017-05-08 DIAGNOSIS — E041 Nontoxic single thyroid nodule: Secondary | ICD-10-CM | POA: Diagnosis not present

## 2017-05-08 DIAGNOSIS — E038 Other specified hypothyroidism: Secondary | ICD-10-CM | POA: Diagnosis not present

## 2017-05-08 DIAGNOSIS — E063 Autoimmune thyroiditis: Secondary | ICD-10-CM | POA: Diagnosis not present

## 2017-05-10 DIAGNOSIS — M9902 Segmental and somatic dysfunction of thoracic region: Secondary | ICD-10-CM | POA: Diagnosis not present

## 2017-05-10 DIAGNOSIS — M9901 Segmental and somatic dysfunction of cervical region: Secondary | ICD-10-CM | POA: Diagnosis not present

## 2017-05-10 DIAGNOSIS — M5412 Radiculopathy, cervical region: Secondary | ICD-10-CM | POA: Diagnosis not present

## 2017-05-10 DIAGNOSIS — R51 Headache: Secondary | ICD-10-CM | POA: Diagnosis not present

## 2017-05-15 DIAGNOSIS — R51 Headache: Secondary | ICD-10-CM | POA: Diagnosis not present

## 2017-05-15 DIAGNOSIS — M9902 Segmental and somatic dysfunction of thoracic region: Secondary | ICD-10-CM | POA: Diagnosis not present

## 2017-05-15 DIAGNOSIS — M5412 Radiculopathy, cervical region: Secondary | ICD-10-CM | POA: Diagnosis not present

## 2017-05-15 DIAGNOSIS — M9901 Segmental and somatic dysfunction of cervical region: Secondary | ICD-10-CM | POA: Diagnosis not present

## 2017-05-16 DIAGNOSIS — M5412 Radiculopathy, cervical region: Secondary | ICD-10-CM | POA: Diagnosis not present

## 2017-05-16 DIAGNOSIS — M9902 Segmental and somatic dysfunction of thoracic region: Secondary | ICD-10-CM | POA: Diagnosis not present

## 2017-05-16 DIAGNOSIS — M9901 Segmental and somatic dysfunction of cervical region: Secondary | ICD-10-CM | POA: Diagnosis not present

## 2017-05-16 DIAGNOSIS — R51 Headache: Secondary | ICD-10-CM | POA: Diagnosis not present

## 2017-05-18 DIAGNOSIS — M9902 Segmental and somatic dysfunction of thoracic region: Secondary | ICD-10-CM | POA: Diagnosis not present

## 2017-05-18 DIAGNOSIS — M5412 Radiculopathy, cervical region: Secondary | ICD-10-CM | POA: Diagnosis not present

## 2017-05-18 DIAGNOSIS — M9901 Segmental and somatic dysfunction of cervical region: Secondary | ICD-10-CM | POA: Diagnosis not present

## 2017-05-18 DIAGNOSIS — R51 Headache: Secondary | ICD-10-CM | POA: Diagnosis not present

## 2017-05-22 DIAGNOSIS — R51 Headache: Secondary | ICD-10-CM | POA: Diagnosis not present

## 2017-05-22 DIAGNOSIS — M9901 Segmental and somatic dysfunction of cervical region: Secondary | ICD-10-CM | POA: Diagnosis not present

## 2017-05-22 DIAGNOSIS — M5412 Radiculopathy, cervical region: Secondary | ICD-10-CM | POA: Diagnosis not present

## 2017-05-22 DIAGNOSIS — M9902 Segmental and somatic dysfunction of thoracic region: Secondary | ICD-10-CM | POA: Diagnosis not present

## 2017-05-23 DIAGNOSIS — M5412 Radiculopathy, cervical region: Secondary | ICD-10-CM | POA: Diagnosis not present

## 2017-05-23 DIAGNOSIS — M9902 Segmental and somatic dysfunction of thoracic region: Secondary | ICD-10-CM | POA: Diagnosis not present

## 2017-05-23 DIAGNOSIS — R51 Headache: Secondary | ICD-10-CM | POA: Diagnosis not present

## 2017-05-23 DIAGNOSIS — M9901 Segmental and somatic dysfunction of cervical region: Secondary | ICD-10-CM | POA: Diagnosis not present

## 2017-08-08 DIAGNOSIS — M9901 Segmental and somatic dysfunction of cervical region: Secondary | ICD-10-CM | POA: Diagnosis not present

## 2017-08-08 DIAGNOSIS — M9902 Segmental and somatic dysfunction of thoracic region: Secondary | ICD-10-CM | POA: Diagnosis not present

## 2017-08-08 DIAGNOSIS — M5412 Radiculopathy, cervical region: Secondary | ICD-10-CM | POA: Diagnosis not present

## 2017-08-08 DIAGNOSIS — R51 Headache: Secondary | ICD-10-CM | POA: Diagnosis not present

## 2017-08-10 DIAGNOSIS — M9902 Segmental and somatic dysfunction of thoracic region: Secondary | ICD-10-CM | POA: Diagnosis not present

## 2017-08-10 DIAGNOSIS — M5412 Radiculopathy, cervical region: Secondary | ICD-10-CM | POA: Diagnosis not present

## 2017-08-10 DIAGNOSIS — R51 Headache: Secondary | ICD-10-CM | POA: Diagnosis not present

## 2017-08-10 DIAGNOSIS — M9901 Segmental and somatic dysfunction of cervical region: Secondary | ICD-10-CM | POA: Diagnosis not present

## 2017-08-14 DIAGNOSIS — M9901 Segmental and somatic dysfunction of cervical region: Secondary | ICD-10-CM | POA: Diagnosis not present

## 2017-08-14 DIAGNOSIS — R51 Headache: Secondary | ICD-10-CM | POA: Diagnosis not present

## 2017-08-14 DIAGNOSIS — M5412 Radiculopathy, cervical region: Secondary | ICD-10-CM | POA: Diagnosis not present

## 2017-08-14 DIAGNOSIS — M9902 Segmental and somatic dysfunction of thoracic region: Secondary | ICD-10-CM | POA: Diagnosis not present

## 2017-09-14 DIAGNOSIS — F4320 Adjustment disorder, unspecified: Secondary | ICD-10-CM | POA: Diagnosis not present

## 2017-09-18 DIAGNOSIS — F4320 Adjustment disorder, unspecified: Secondary | ICD-10-CM | POA: Diagnosis not present

## 2017-09-26 DIAGNOSIS — F4320 Adjustment disorder, unspecified: Secondary | ICD-10-CM | POA: Diagnosis not present

## 2017-10-03 DIAGNOSIS — F4320 Adjustment disorder, unspecified: Secondary | ICD-10-CM | POA: Diagnosis not present

## 2017-10-17 DIAGNOSIS — F4320 Adjustment disorder, unspecified: Secondary | ICD-10-CM | POA: Diagnosis not present

## 2017-10-18 DIAGNOSIS — Z1231 Encounter for screening mammogram for malignant neoplasm of breast: Secondary | ICD-10-CM | POA: Diagnosis not present

## 2017-11-01 DIAGNOSIS — F4320 Adjustment disorder, unspecified: Secondary | ICD-10-CM | POA: Diagnosis not present

## 2017-12-25 DIAGNOSIS — M25552 Pain in left hip: Secondary | ICD-10-CM | POA: Diagnosis not present

## 2017-12-25 DIAGNOSIS — R945 Abnormal results of liver function studies: Secondary | ICD-10-CM | POA: Diagnosis not present

## 2017-12-25 DIAGNOSIS — E559 Vitamin D deficiency, unspecified: Secondary | ICD-10-CM | POA: Diagnosis not present

## 2017-12-25 DIAGNOSIS — M25561 Pain in right knee: Secondary | ICD-10-CM | POA: Diagnosis not present

## 2017-12-25 DIAGNOSIS — M25551 Pain in right hip: Secondary | ICD-10-CM | POA: Diagnosis not present

## 2017-12-25 DIAGNOSIS — R768 Other specified abnormal immunological findings in serum: Secondary | ICD-10-CM | POA: Diagnosis not present

## 2017-12-25 DIAGNOSIS — M5136 Other intervertebral disc degeneration, lumbar region: Secondary | ICD-10-CM | POA: Diagnosis not present

## 2017-12-25 DIAGNOSIS — M25562 Pain in left knee: Secondary | ICD-10-CM | POA: Diagnosis not present

## 2017-12-25 DIAGNOSIS — Z13820 Encounter for screening for osteoporosis: Secondary | ICD-10-CM | POA: Diagnosis not present

## 2017-12-25 DIAGNOSIS — M2550 Pain in unspecified joint: Secondary | ICD-10-CM | POA: Diagnosis not present

## 2018-01-02 DIAGNOSIS — H00025 Hordeolum internum left lower eyelid: Secondary | ICD-10-CM | POA: Diagnosis not present

## 2018-01-16 DIAGNOSIS — H524 Presbyopia: Secondary | ICD-10-CM | POA: Diagnosis not present

## 2018-01-22 DIAGNOSIS — M9901 Segmental and somatic dysfunction of cervical region: Secondary | ICD-10-CM | POA: Diagnosis not present

## 2018-01-22 DIAGNOSIS — M5412 Radiculopathy, cervical region: Secondary | ICD-10-CM | POA: Diagnosis not present

## 2018-01-22 DIAGNOSIS — M9902 Segmental and somatic dysfunction of thoracic region: Secondary | ICD-10-CM | POA: Diagnosis not present

## 2018-01-22 DIAGNOSIS — R51 Headache: Secondary | ICD-10-CM | POA: Diagnosis not present

## 2018-01-23 DIAGNOSIS — M9902 Segmental and somatic dysfunction of thoracic region: Secondary | ICD-10-CM | POA: Diagnosis not present

## 2018-01-23 DIAGNOSIS — M5412 Radiculopathy, cervical region: Secondary | ICD-10-CM | POA: Diagnosis not present

## 2018-01-23 DIAGNOSIS — M9901 Segmental and somatic dysfunction of cervical region: Secondary | ICD-10-CM | POA: Diagnosis not present

## 2018-01-23 DIAGNOSIS — R51 Headache: Secondary | ICD-10-CM | POA: Diagnosis not present

## 2018-01-24 DIAGNOSIS — M9902 Segmental and somatic dysfunction of thoracic region: Secondary | ICD-10-CM | POA: Diagnosis not present

## 2018-01-24 DIAGNOSIS — M9901 Segmental and somatic dysfunction of cervical region: Secondary | ICD-10-CM | POA: Diagnosis not present

## 2018-01-24 DIAGNOSIS — M5412 Radiculopathy, cervical region: Secondary | ICD-10-CM | POA: Diagnosis not present

## 2018-01-24 DIAGNOSIS — R51 Headache: Secondary | ICD-10-CM | POA: Diagnosis not present

## 2018-01-31 DIAGNOSIS — R768 Other specified abnormal immunological findings in serum: Secondary | ICD-10-CM | POA: Diagnosis not present

## 2018-01-31 DIAGNOSIS — M47817 Spondylosis without myelopathy or radiculopathy, lumbosacral region: Secondary | ICD-10-CM | POA: Diagnosis not present

## 2018-01-31 DIAGNOSIS — M170 Bilateral primary osteoarthritis of knee: Secondary | ICD-10-CM | POA: Diagnosis not present

## 2018-02-07 DIAGNOSIS — E041 Nontoxic single thyroid nodule: Secondary | ICD-10-CM | POA: Diagnosis not present

## 2018-02-07 DIAGNOSIS — Z803 Family history of malignant neoplasm of breast: Secondary | ICD-10-CM | POA: Diagnosis not present

## 2018-02-07 DIAGNOSIS — Z79899 Other long term (current) drug therapy: Secondary | ICD-10-CM | POA: Diagnosis not present

## 2018-02-07 DIAGNOSIS — Z8249 Family history of ischemic heart disease and other diseases of the circulatory system: Secondary | ICD-10-CM | POA: Diagnosis not present

## 2018-02-07 DIAGNOSIS — E063 Autoimmune thyroiditis: Secondary | ICD-10-CM | POA: Diagnosis not present

## 2018-02-07 DIAGNOSIS — D351 Benign neoplasm of parathyroid gland: Secondary | ICD-10-CM | POA: Diagnosis not present

## 2018-02-07 DIAGNOSIS — E038 Other specified hypothyroidism: Secondary | ICD-10-CM | POA: Diagnosis not present

## 2018-02-13 DIAGNOSIS — M545 Low back pain: Secondary | ICD-10-CM | POA: Diagnosis not present

## 2018-02-16 DIAGNOSIS — M545 Low back pain: Secondary | ICD-10-CM | POA: Diagnosis not present

## 2018-02-19 DIAGNOSIS — M545 Low back pain: Secondary | ICD-10-CM | POA: Diagnosis not present

## 2018-02-21 DIAGNOSIS — M542 Cervicalgia: Secondary | ICD-10-CM | POA: Diagnosis not present

## 2018-02-21 DIAGNOSIS — M545 Low back pain: Secondary | ICD-10-CM | POA: Diagnosis not present

## 2018-02-28 DIAGNOSIS — M545 Low back pain: Secondary | ICD-10-CM | POA: Diagnosis not present

## 2018-03-01 DIAGNOSIS — L821 Other seborrheic keratosis: Secondary | ICD-10-CM | POA: Diagnosis not present

## 2018-03-01 DIAGNOSIS — Z1283 Encounter for screening for malignant neoplasm of skin: Secondary | ICD-10-CM | POA: Diagnosis not present

## 2018-03-01 DIAGNOSIS — L578 Other skin changes due to chronic exposure to nonionizing radiation: Secondary | ICD-10-CM | POA: Diagnosis not present

## 2018-03-01 DIAGNOSIS — K580 Irritable bowel syndrome with diarrhea: Secondary | ICD-10-CM | POA: Diagnosis not present

## 2018-03-01 DIAGNOSIS — E039 Hypothyroidism, unspecified: Secondary | ICD-10-CM | POA: Insufficient documentation

## 2018-03-01 DIAGNOSIS — D229 Melanocytic nevi, unspecified: Secondary | ICD-10-CM | POA: Diagnosis not present

## 2018-03-01 DIAGNOSIS — Z0000 Encounter for general adult medical examination without abnormal findings: Secondary | ICD-10-CM | POA: Diagnosis not present

## 2018-03-14 DIAGNOSIS — M545 Low back pain: Secondary | ICD-10-CM | POA: Diagnosis not present

## 2018-03-19 DIAGNOSIS — M542 Cervicalgia: Secondary | ICD-10-CM | POA: Diagnosis not present

## 2018-03-19 DIAGNOSIS — M545 Low back pain: Secondary | ICD-10-CM | POA: Diagnosis not present

## 2018-04-09 DIAGNOSIS — M9902 Segmental and somatic dysfunction of thoracic region: Secondary | ICD-10-CM | POA: Diagnosis not present

## 2018-04-09 DIAGNOSIS — M9901 Segmental and somatic dysfunction of cervical region: Secondary | ICD-10-CM | POA: Diagnosis not present

## 2018-04-09 DIAGNOSIS — R51 Headache: Secondary | ICD-10-CM | POA: Diagnosis not present

## 2018-04-09 DIAGNOSIS — M5412 Radiculopathy, cervical region: Secondary | ICD-10-CM | POA: Diagnosis not present

## 2018-04-11 DIAGNOSIS — G5702 Lesion of sciatic nerve, left lower limb: Secondary | ICD-10-CM | POA: Diagnosis not present

## 2018-04-11 DIAGNOSIS — K580 Irritable bowel syndrome with diarrhea: Secondary | ICD-10-CM | POA: Diagnosis not present

## 2018-04-11 DIAGNOSIS — R238 Other skin changes: Secondary | ICD-10-CM | POA: Diagnosis not present

## 2018-04-11 DIAGNOSIS — D649 Anemia, unspecified: Secondary | ICD-10-CM | POA: Diagnosis not present

## 2018-04-17 DIAGNOSIS — D649 Anemia, unspecified: Secondary | ICD-10-CM | POA: Diagnosis not present

## 2018-04-20 DIAGNOSIS — K580 Irritable bowel syndrome with diarrhea: Secondary | ICD-10-CM | POA: Diagnosis not present

## 2018-05-16 DIAGNOSIS — M47817 Spondylosis without myelopathy or radiculopathy, lumbosacral region: Secondary | ICD-10-CM | POA: Diagnosis not present

## 2018-05-16 DIAGNOSIS — M170 Bilateral primary osteoarthritis of knee: Secondary | ICD-10-CM | POA: Diagnosis not present

## 2018-05-16 DIAGNOSIS — M7918 Myalgia, other site: Secondary | ICD-10-CM | POA: Diagnosis not present

## 2018-05-16 DIAGNOSIS — R768 Other specified abnormal immunological findings in serum: Secondary | ICD-10-CM | POA: Diagnosis not present

## 2018-05-23 DIAGNOSIS — K219 Gastro-esophageal reflux disease without esophagitis: Secondary | ICD-10-CM | POA: Diagnosis not present

## 2018-05-23 DIAGNOSIS — K580 Irritable bowel syndrome with diarrhea: Secondary | ICD-10-CM | POA: Diagnosis not present

## 2018-05-23 DIAGNOSIS — E039 Hypothyroidism, unspecified: Secondary | ICD-10-CM | POA: Diagnosis not present

## 2018-05-23 DIAGNOSIS — D500 Iron deficiency anemia secondary to blood loss (chronic): Secondary | ICD-10-CM | POA: Diagnosis not present

## 2018-05-23 DIAGNOSIS — M5430 Sciatica, unspecified side: Secondary | ICD-10-CM | POA: Diagnosis not present

## 2018-08-16 DIAGNOSIS — R0789 Other chest pain: Secondary | ICD-10-CM | POA: Diagnosis not present

## 2018-08-16 DIAGNOSIS — R0989 Other specified symptoms and signs involving the circulatory and respiratory systems: Secondary | ICD-10-CM | POA: Diagnosis not present

## 2018-09-04 DIAGNOSIS — R1310 Dysphagia, unspecified: Secondary | ICD-10-CM | POA: Diagnosis not present

## 2018-09-04 DIAGNOSIS — K3189 Other diseases of stomach and duodenum: Secondary | ICD-10-CM | POA: Diagnosis not present

## 2018-09-04 DIAGNOSIS — K2950 Unspecified chronic gastritis without bleeding: Secondary | ICD-10-CM | POA: Diagnosis not present

## 2018-09-04 DIAGNOSIS — K449 Diaphragmatic hernia without obstruction or gangrene: Secondary | ICD-10-CM | POA: Diagnosis not present

## 2018-09-07 ENCOUNTER — Other Ambulatory Visit: Payer: Self-pay

## 2018-09-07 ENCOUNTER — Encounter: Payer: Self-pay | Admitting: Obstetrics and Gynecology

## 2018-09-07 ENCOUNTER — Ambulatory Visit (INDEPENDENT_AMBULATORY_CARE_PROVIDER_SITE_OTHER): Payer: BLUE CROSS/BLUE SHIELD | Admitting: Obstetrics and Gynecology

## 2018-09-07 VITALS — BP 104/60 | HR 66 | Ht 62.5 in | Wt 149.0 lb

## 2018-09-07 DIAGNOSIS — Z01419 Encounter for gynecological examination (general) (routine) without abnormal findings: Secondary | ICD-10-CM

## 2018-09-07 DIAGNOSIS — R635 Abnormal weight gain: Secondary | ICD-10-CM

## 2018-09-07 DIAGNOSIS — Z1339 Encounter for screening examination for other mental health and behavioral disorders: Secondary | ICD-10-CM

## 2018-09-07 DIAGNOSIS — Z23 Encounter for immunization: Secondary | ICD-10-CM

## 2018-09-07 DIAGNOSIS — Z1331 Encounter for screening for depression: Secondary | ICD-10-CM

## 2018-09-07 MED ORDER — TETANUS-DIPHTH-ACELL PERTUSSIS 5-2.5-18.5 LF-MCG/0.5 IM SUSP
0.5000 mL | Freq: Once | INTRAMUSCULAR | Status: AC
Start: 2018-09-07 — End: 2018-09-07
  Administered 2018-09-07: 0.5 mL via INTRAMUSCULAR

## 2018-09-07 NOTE — Progress Notes (Signed)
Routine Annual Gynecology Examination   PCP: Patient, No Pcp Per  Chief Complaint  Patient presents with  . Gynecologic Exam    Hot flashes/Weight gain   History of Present Illness: Patient is a 53 y.o. [id number] presents for annual exam.   Menopausal bleeding: denies  Menopausal symptoms: reports, mild hot flashes on Paxil.  She has trouble sleeping, but this is well managed with trazodone.  She does get "down" easily, especially  Breast symptoms: denies  Last pap smear: 2 years ago.  Result Normal  Last mammogram: 10/18/2017 .  Result BiRads 1  She describes a 20 pound weight gain the past two years.  She has a history of thyroid disease.   She is unsure of why the weight gain has happened. She denies skin changes and hair changes. Her thyroid disease is managed by Dr. Kathlen Mody at [facility].  She had normal TFTs in 02/2018.  Her weight at her visit with her endocrinologist on 02/2018 was 133 pounds. She states that her scale at home has shown a gradual increase in weight and that she has measured herself in the 140+ lb range at home. She has had no new medications. She denies bloating.  She takes trazodone to help her sleep at night. She takes Paxil to help with hot flashes.  The Paxil does help with her hot flashes.  They are tolerable on this medication.    She had an upper endoscopy this week due to a sensation in her throat. The endoscopy was negative.   Last Colonoscopy: 5 years ago, diverticulosis.    Past Medical History:  Diagnosis Date  . Diverticula, colon 2015  . Hx of hysterectomy 2004   d/t abnl pap HGSIL  . IBS (irritable bowel syndrome)   . 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 Thyroid disease    Past Surgical History:  Procedure Laterality Date  . APPENDECTOMY  10/12/1993   LAP  . BREAST SURGERY Bilateral 2003   augmentation  . CERVICAL BIOPSY  W/ LOOP ELECTRODE EXCISION  03/26/2002   SEVERE DYSP  . COLONOSCOPY  2015   Dr. Rayann Heman  . TONSILLECTOMY   1975  . TOTAL VAGINAL HYSTERECTOMY  2002   [facility]    Prior to Admission medications   Medication Sig Start Date End Date Taking? Authorizing Provider  levothyroxine (SYNTHROID, LEVOTHROID) 50 MCG tablet Take 50 mcg by mouth daily before breakfast.   Yes [provider]  pantoprazole (PROTONIX) 40 MG tablet Take by mouth. 08/07/18 08/07/19 Yes [provider]  PARoxetine (PAXIL) 10 MG tablet Take by mouth.   Yes [provider]  traZODone (DESYREL) 50 MG tablet Take by mouth.   Yes [provider]  Multiple Vitamin (MULTIVITAMIN) capsule Take by mouth.    [provider]    Allergies  Allergen Reactions  . Sulfa Antibiotics Hives and Rash   Obstetric History: [id number]  Social History   Socioeconomic History  . Marital status: Married    Spouse name: Not on file  . Number of children: 2  . Years of education: 57  . Highest education level: Not on file  Occupational History  . Not on file  Social Needs  . Financial resource strain: Not on file  . Food insecurity:    Worry: Not on file    Inability: Not on file  . Transportation needs:    Medical: Not on file    Non-medical: Not on file  Tobacco Use  .  Smoking status: Never Smoker  . Smokeless tobacco: Never Used  Substance and Sexual Activity  . Alcohol use: No    Comment: OCC  . Drug use: No  . Sexual activity: Yes    Birth control/protection: Surgical  Lifestyle  . Physical activity:    Days per week: 5 days    Minutes per session: 50 min  . Stress: Not on file  Relationships  . Social connections:    Talks on phone: Not on file    Gets together: Not on file    Attends religious service: Not on file    Active member of club or organization: Not on file    Attends meetings of clubs or organizations: Not on file    Relationship status: Not on file  . Intimate partner violence:    Fear of current or ex partner: Not on file    Emotionally abused: Not on file    Physically  abused: Not on file    Forced sexual activity: Not on file  Other Topics Concern  . Not on file  Social History Narrative  . Not on file   Family History  Problem Relation Age of Onset  . Cancer Father        unknown  . Hypertension Mother   . Diabetes Sister        TYPE 2  . Breast cancer Maternal Grandmother 60   Review of Systems  Constitutional: Negative.        Weight gain (20 pounds in the 2 years)  HENT: Negative.   Eyes: Negative.   Respiratory: Negative.   Cardiovascular: Negative.   Gastrointestinal: Negative.   Genitourinary: Negative.   Musculoskeletal: Negative.   Skin: Negative.   Neurological: Negative.   Psychiatric/Behavioral: Negative.    Physical Exam Vitals: BP 104/60 (BP Location: Left Arm, Patient Position: Sitting, Cuff Size: Normal)   Pulse 66   Ht 5' 2.5" (1.588 m)   Wt 149 lb (67.6 kg)   BMI 26.82 kg/m   Physical Exam Constitutional:      General: She is not in acute distress.    Appearance: Normal appearance. She is well-developed.  Genitourinary:     Pelvic exam was performed with patient in the lithotomy position.     Vulva, urethra, bladder, vagina, right adnexa and left adnexa normal.     Cervix is absent.     Uterus is absent.  HENT:     Head: Normocephalic and atraumatic.  Eyes:     General: No scleral icterus.    Conjunctiva/sclera: Conjunctivae normal.  Neck:     Musculoskeletal: Normal range of motion and neck supple.  Cardiovascular:     Rate and Rhythm: Normal rate and regular rhythm.     Heart sounds: No murmur. No friction rub. No gallop.   Pulmonary:     Effort: Pulmonary effort is normal. No respiratory distress.     Breath sounds: Normal breath sounds. No wheezing or rales.  Abdominal:     General: Bowel sounds are normal. There is no distension.     Palpations: Abdomen is soft. There is no mass.     Tenderness: There is no abdominal tenderness. There is no guarding or rebound.  Musculoskeletal: Normal range of  motion.  Neurological:     General: No focal deficit present.     Mental Status: She is alert and oriented to person, place, and time.     Cranial Nerves: No cranial nerve deficit.  Skin:  General: Skin is warm and dry.     Findings: No erythema.  Psychiatric:        Mood and Affect: Mood normal.        Behavior: Behavior normal.        Judgment: Judgment normal.      Female chaperone present for pelvic and breast  portions of the physical exam  Results: AUDIT Questionnaire (screen for alcoholism): 1 PHQ-9: 2   Assessment and Plan:  53 y.o. G59P2002 female here for routine annual gynecologic examination  Plan: Problem List Items Addressed This Visit    None    Visit Diagnoses    Women's annual routine gynecological examination    -  Primary   Screening for depression       Screening for alcoholism       Need for prophylactic vaccination against diphtheria-tetanus-pertussis (DTP)       Relevant Medications   Tdap (BOOSTRIX) injection 0.5 mL (Completed)   Weight gain          Screening: -- Blood pressure screen normal -- Colonoscopy - not due -- Mammogram - due in April this year -- Weight screening: some weight gain noted.  Much higher value today than was noted in August 2019 at [facility]. She states that her scale at home is more consistent with our scale here.  No obvious findings to suggest an etiology. Likely multifactorial.  Suggest she speak with endocrinology and her PCP first. If no etiology, may consider a short trial of phentermine. -- Depression screening negative (PHQ-9) -- Nutrition: normal -- cholesterol screening: per PCP -- osteoporosis screening: not due -- tobacco screening: not using -- alcohol screening: AUDIT questionnaire indicates low-risk usage. -- family history of breast cancer screening: done. not at high risk. -- no evidence of domestic violence or intimate partner violence. -- STD screening: gonorrhea/chlamydia NAAT not collected per  patient request. -- pap smear not collected per ASCCP guidelines -- HPV vaccination series: not eligilbe   Received TDaP today.   Prentice Docker, MD 09/07/2018 1:47 PM

## 2018-11-14 DIAGNOSIS — R768 Other specified abnormal immunological findings in serum: Secondary | ICD-10-CM | POA: Diagnosis not present

## 2018-11-20 DIAGNOSIS — D500 Iron deficiency anemia secondary to blood loss (chronic): Secondary | ICD-10-CM | POA: Diagnosis not present

## 2018-11-20 DIAGNOSIS — Z1231 Encounter for screening mammogram for malignant neoplasm of breast: Secondary | ICD-10-CM | POA: Diagnosis not present

## 2018-11-20 DIAGNOSIS — E039 Hypothyroidism, unspecified: Secondary | ICD-10-CM | POA: Diagnosis not present

## 2018-11-29 DIAGNOSIS — D485 Neoplasm of uncertain behavior of skin: Secondary | ICD-10-CM | POA: Diagnosis not present

## 2018-11-29 DIAGNOSIS — L99 Other disorders of skin and subcutaneous tissue in diseases classified elsewhere: Secondary | ICD-10-CM | POA: Diagnosis not present

## 2019-02-07 DIAGNOSIS — E782 Mixed hyperlipidemia: Secondary | ICD-10-CM | POA: Insufficient documentation

## 2019-02-07 DIAGNOSIS — Z8249 Family history of ischemic heart disease and other diseases of the circulatory system: Secondary | ICD-10-CM | POA: Insufficient documentation

## 2019-05-10 ENCOUNTER — Emergency Department
Admission: EM | Admit: 2019-05-10 | Discharge: 2019-05-10 | Disposition: A | Discharge status: Home / Self Care | Payer: 59 | Attending: Emergency Medicine | Admitting: Emergency Medicine

## 2019-05-10 ENCOUNTER — Emergency Department: Payer: 59

## 2019-05-10 ENCOUNTER — Other Ambulatory Visit: Payer: Self-pay

## 2019-05-10 DIAGNOSIS — Y92017 Garden or yard in single-family (private) house as the place of occurrence of the external cause: Secondary | ICD-10-CM | POA: Insufficient documentation

## 2019-05-10 DIAGNOSIS — Z79899 Other long term (current) drug therapy: Secondary | ICD-10-CM | POA: Insufficient documentation

## 2019-05-10 DIAGNOSIS — Y999 Unspecified external cause status: Secondary | ICD-10-CM | POA: Diagnosis not present

## 2019-05-10 DIAGNOSIS — S62639B Displaced fracture of distal phalanx of unspecified finger, initial encounter for open fracture: Secondary | ICD-10-CM

## 2019-05-10 DIAGNOSIS — W231XXA Caught, crushed, jammed, or pinched between stationary objects, initial encounter: Secondary | ICD-10-CM | POA: Insufficient documentation

## 2019-05-10 DIAGNOSIS — Y939 Activity, unspecified: Secondary | ICD-10-CM | POA: Diagnosis not present

## 2019-05-10 DIAGNOSIS — S61312A Laceration without foreign body of right middle finger with damage to nail, initial encounter: Secondary | ICD-10-CM | POA: Diagnosis not present

## 2019-05-10 DIAGNOSIS — S62632B Displaced fracture of distal phalanx of right middle finger, initial encounter for open fracture: Secondary | ICD-10-CM | POA: Diagnosis not present

## 2019-05-10 MED ORDER — LIDOCAINE HCL (PF) 1 % IJ SOLN
5.0000 mL | Freq: Once | INTRAMUSCULAR | Status: AC
  Administered 2019-05-10: 5 mL via INTRADERMAL
  Filled 2019-05-10: qty 5

## 2019-05-10 MED ORDER — CLINDAMYCIN PHOSPHATE 600 MG/4ML IJ SOLN
600.0000 mg | Freq: Once | INTRAMUSCULAR | Status: AC
Start: 2019-05-10 — End: 2019-05-10
  Administered 2019-05-10: 600 mg via INTRAMUSCULAR
  Filled 2019-05-10: qty 4

## 2019-05-10 MED ORDER — OXYCODONE-ACETAMINOPHEN 5-325 MG PO TABS: 1.0000 | ORAL_TABLET | Freq: Four times a day (QID) | ORAL | 0 refills | Status: AC | PRN

## 2019-05-10 MED ORDER — CLINDAMYCIN HCL 300 MG PO CAPS: 300.0000 mg | ORAL_CAPSULE | Freq: Three times a day (TID) | ORAL | 0 refills | Status: AC

## 2019-05-10 MED ORDER — OXYCODONE-ACETAMINOPHEN 5-325 MG PO TABS
1.0000 | ORAL_TABLET | Freq: Once | ORAL | Status: AC
  Administered 2019-05-10: 1 via ORAL
  Filled 2019-05-10: qty 1

## 2019-05-10 NOTE — Discharge Instructions (Signed)
The tip of your finger is fractured.  The laceration is still open since there was not enough tissue to fully repair.  You also damaged your nailbed in the injury.  Please begin clindamycin today to prevent any infection in your finger.  You can change the bandage daily.  Wear splint.  Please call hand orthopedics today for an appointment for recheck and further evaluation early next week.

## 2019-05-10 NOTE — ED Triage Notes (Signed)
Pt reports that she was moving a grill and it slipped and it landed on her middle right finger.

## 2019-05-10 NOTE — ED Provider Notes (Signed)
[facility] Emergency Department Provider Note  ____________________________________________  Time seen: Approximately 11:55 AM  I have reviewed the triage vital signs and the nursing notes.   HISTORY  Chief Complaint Finger Injury    HPI Tiffany Miller is a 53 y.o. female that presents to the emergency department for evaluation of right middle finger injury.  Patient states that she was cleaning a grill when it landed onto her finger.  She was getting ready for a party she is having tomorrow.  Last tetanus shot was about 1 year ago.   Past Medical History:  Diagnosis Date  . Diverticula, colon 2015  . Hx of hysterectomy 2004   d/t abnl pap HGSIL  . IBS (irritable bowel syndrome)   . 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 Thyroid disease     Patient Active Problem List   Diagnosis Date Noted  . Abdominal pain, left lower quadrant 10/08/2013  . Gallstones 09/17/2013    Past Surgical History:  Procedure Laterality Date  . APPENDECTOMY  10/12/1993   LAP  . BREAST SURGERY Bilateral 2003   augmentation  . CERVICAL BIOPSY  W/ LOOP ELECTRODE EXCISION  03/26/2002   SEVERE DYSP  . COLONOSCOPY  2015   Dr. Rayann Heman  . TONSILLECTOMY  1975  . TOTAL VAGINAL HYSTERECTOMY  2002   [facility]    Prior to Admission medications   Medication Sig Start Date End Date Taking? Authorizing Provider  clindamycin (CLEOCIN) 300 MG capsule Take 1 capsule (300 mg total) by mouth 3 (three) times daily for 10 days. 05/10/19 05/20/19  Laban Emperor, PA-C  levothyroxine (SYNTHROID, LEVOTHROID) 50 MCG tablet Take 50 mcg by mouth daily before breakfast.    [provider]  Multiple Vitamin (MULTIVITAMIN) capsule Take by mouth.    [provider]  oxyCODONE-acetaminophen (PERCOCET) 5-325 MG tablet Take 1 tablet by mouth every 6 (six) hours as needed for up to 4 days for severe pain. 05/10/19 05/14/19  Laban Emperor, PA-C  pantoprazole (PROTONIX) 40  MG tablet Take by mouth. 08/07/18 08/07/19  [provider]  PARoxetine (PAXIL) 10 MG tablet Take by mouth.    [provider]  traZODone (DESYREL) 50 MG tablet Take by mouth.    [provider]    Allergies Sulfa antibiotics  Family History  Problem Relation Age of Onset  . Cancer Father        unknown  . Hypertension Mother   . Diabetes Sister        TYPE 2  . Breast cancer Maternal Grandmother 73    Social History Social History   Tobacco Use  . Smoking status: Never Smoker  . Smokeless tobacco: Never Used  Substance Use Topics  . Alcohol use: No    Comment: OCC  . Drug use: No     Review of Systems   Gastrointestinal: No nausea, no vomiting.  Musculoskeletal: Positive for finger pain. Skin: Negative for rash, abrasions, ecchymosis.  Positive for laceration.   ____________________________________________   PHYSICAL EXAM:  VITAL SIGNS: ED Triage Vitals  Enc Vitals Group     BP 05/10/19 1136 129/72     Pulse Rate 05/10/19 1136 67     Resp 05/10/19 1136 18     Temp 05/10/19 1136 99 F (37.2 C)     Temp Source 05/10/19 1136 Oral     SpO2 05/10/19 1136 99 %     Weight 05/10/19 1130 140 lb (63.5 kg)  Height 05/10/19 1130 5\' 2"  (1.575 m)     Head Circumference --      Peak Flow --      Pain Score 05/10/19 1129 10     Pain Loc --      Pain Edu? --      Excl. in [place]? --      Constitutional: Alert and oriented. Well appearing and in no acute distress. Eyes: Conjunctivae are normal. PERRL. EOMI. Head: Atraumatic. ENT:      Ears:      Nose: No congestion/rhinnorhea.      Mouth/Throat: Mucous membranes are moist.  Neck: No stridor. Cardiovascular: Normal rate, regular rhythm.  Good peripheral circulation. Respiratory: Normal respiratory effort without tachypnea or retractions. Lungs CTAB. Good air entry to the bases with no decreased or absent breath sounds. Musculoskeletal: Full range of motion to all extremities. No gross  deformities appreciated. Neurologic:  Normal speech and language. No gross focal neurologic deficits are appreciated.  Skin:  Skin is warm, dry.  Proximal nail completely uplifted from the nail matrix.  Jagged laceration surrounding the distal nail with macerated skin. Psychiatric: Mood and affect are normal. Speech and behavior are normal. Patient exhibits appropriate insight and judgement.   ____________________________________________   LABS (all labs ordered are listed, but only abnormal results are displayed)  Labs Reviewed - No data to display ____________________________________________  EKG   ____________________________________________  RADIOLOGY Robinette Haines, personally viewed and evaluated these images (plain radiographs) as part of my medical decision making, as well as reviewing the written report by the radiologist.  Dg Finger Middle Right  Result Date: 05/10/2019 CLINICAL DATA:  Crushed middle finger, pain. EXAM: RIGHT MIDDLE FINGER 2+V COMPARISON:  None. FINDINGS: Three views of the right middle finger with highly comminuted tuft/distal phalangeal fracture with distortion of soft tissues overlying the distal phalanx. Lucency overlying the dorsum of the fracture site deep to the nail bed. IMPRESSION: Highly comminuted tuft/distal phalangeal fracture with underlying soft tissue swelling. No signs of foreign body. Small amount of gas in the region of the nailbed suspicious for open fracture with nail bed injury. Clinical correlation suggested. Electronically Signed   By: Zetta Bills M.D.   On: 05/10/2019 12:02    ____________________________________________    PROCEDURES  Procedure(s) performed:    Procedures  LACERATION REPAIR Performed by: Laban Emperor  Consent: Verbal consent obtained.  Consent given by: patient  Prepped and Draped in normal sterile fashion  Wound explored: No foreign bodies   Laceration Location: finger  Laceration Length:  2  Anesthesia: None  Local anesthetic: lidocaine 1% without epinephrine  Anesthetic total: 4 ml  Irrigation method: syringe  Amount of cleaning: 587ml normal saline  Skin closure: 4-0 nylon and 3-0 nylon  Number of sutures: 2,  3-0 sutures through proximal fingernail to anchor 2 4-0 sutures to skin  Technique: Simple interrupted  Patient tolerance: Patient tolerated the procedure well with no immediate complications.   Medications  oxyCODONE-acetaminophen (PERCOCET/ROXICET) 5-325 MG per tablet 1 tablet (1 tablet Oral Given 05/10/19 1218)  lidocaine (PF) (XYLOCAINE) 1 % injection 5 mL (5 mLs Intradermal Given 05/10/19 1218)  clindamycin (CLEOCIN) injection 600 mg (600 mg Intramuscular Given 05/10/19 1334)     ____________________________________________   INITIAL IMPRESSION / ASSESSMENT AND PLAN / ED COURSE  Pertinent labs & imaging results that were available during my care of the patient were reviewed by me and considered in my medical decision making (see chart for details).  Review of  the [place] CSRS was performed in accordance of the [place] prior to dispensing any controlled drugs.     Patient presented to emergency department for evaluation of finger injury.  Vital signs and exam are reassuring.  X-ray consistent with distal comminuted fracture.  Patient's nail was uplifted from the nail matrix.  It was anchored back into place.  Viable tissue was sutured.  There is macerated skin surrounding the nail that was unable to be sutured.  IM clindamycin was given.  Splint was placed.  Patient will be discharged home with prescriptions for clindamycin. Patient is to follow up with hand Ortho as directed.  Referral was given to Dr. Peggye Ley.  Patient is given ED precautions to return to the ED for any worsening or new symptoms.  Tiffany Miller was evaluated in Emergency Department on 05/10/2019 for the symptoms described in the history of present illness. She was evaluated in the  context of the global COVID-19 pandemic, which necessitated consideration that the patient might be at risk for infection with the SARS-CoV-2 virus that causes COVID-19. Institutional protocols and algorithms that pertain to the evaluation of patients at risk for COVID-19 are in a state of rapid change based on information released by regulatory bodies including the CDC and federal and state organizations. These policies and algorithms were followed during the patient's care in the ED.   ____________________________________________  FINAL CLINICAL IMPRESSION(S) / ED DIAGNOSES  Final diagnoses:  Laceration of right middle finger without foreign body with damage to nail, initial encounter  Open fracture of tuft of distal phalanx of finger      NEW MEDICATIONS STARTED DURING THIS VISIT:  ED Discharge Orders         Ordered    oxyCODONE-acetaminophen (PERCOCET) 5-325 MG tablet  Every 6 hours PRN     05/10/19 1337    clindamycin (CLEOCIN) 300 MG capsule  3 times daily     05/10/19 1337              This chart was dictated using voice recognition software/Dragon. Despite best efforts to proofread, errors can occur which can change the meaning. Any change was purely unintentional.    Laban Emperor, PA-C 05/10/19 1511    Harvest Dark, MD 05/13/19 2153

## 2019-05-10 NOTE — ED Notes (Signed)
See triage note  Presents with injury ro right middle finger  States cleaning a grill and lip cane down on finger

## 2019-05-20 DIAGNOSIS — M170 Bilateral primary osteoarthritis of knee: Secondary | ICD-10-CM | POA: Insufficient documentation

## 2019-08-11 ENCOUNTER — Ambulatory Visit: Payer: 59 | Attending: Internal Medicine

## 2019-08-11 DIAGNOSIS — Z23 Encounter for immunization: Secondary | ICD-10-CM | POA: Insufficient documentation

## 2019-08-11 NOTE — Progress Notes (Signed)
   Covid-19 Vaccination Clinic  Name:  Tiffany Miller    MRN: [id number] DOB: [birth year]  08/11/2019  [name] was observed post Covid-19 immunization for 15 minutes without incidence. She was provided with Vaccine Information Sheet and instruction to access the V-Safe system.   [name] was instructed to call 911 with any severe reactions post vaccine: Marland Kitchen Difficulty breathing  . Swelling of your face and throat  . A fast heartbeat  . A bad rash all over your body  . Dizziness and weakness    Immunizations Administered    Name Date Dose VIS Date Route   Pfizer COVID-19 Vaccine 08/11/2019 12:12 PM 0.3 mL 06/14/2019 Intramuscular   Manufacturer: [place]   Lot: [id number]   [place]: [id number]

## 2019-09-04 ENCOUNTER — Ambulatory Visit: Payer: 59 | Attending: Internal Medicine

## 2019-09-04 DIAGNOSIS — Z23 Encounter for immunization: Secondary | ICD-10-CM | POA: Insufficient documentation

## 2019-09-04 NOTE — Progress Notes (Signed)
   Covid-19 Vaccination Clinic  Name:  Tiffany Miller    MRN: [id number] DOB: 03-Jun-1966  09/04/2019  [name] was observed post Covid-19 immunization for 15 minutes without incident. She was provided with Vaccine Information Sheet and instruction to access the V-Safe system.   [name] was instructed to call 911 with any severe reactions post vaccine: Marland Kitchen Difficulty breathing  . Swelling of face and throat  . A fast heartbeat  . A bad rash all over body  . Dizziness and weakness   Immunizations Administered    Name Date Dose VIS Date Route   Pfizer COVID-19 Vaccine 09/04/2019  8:57 AM 0.3 mL 06/14/2019 Intramuscular   Manufacturer: [place]   Lot: [id number]   [place]: [id number]

## 2019-09-11 ENCOUNTER — Ambulatory Visit (INDEPENDENT_AMBULATORY_CARE_PROVIDER_SITE_OTHER): Payer: 59 | Admitting: Obstetrics and Gynecology

## 2019-09-11 ENCOUNTER — Other Ambulatory Visit: Payer: Self-pay

## 2019-09-11 ENCOUNTER — Encounter: Payer: Self-pay | Admitting: Obstetrics and Gynecology

## 2019-09-11 VITALS — BP 126/74 | Ht 62.0 in | Wt 148.0 lb

## 2019-09-11 DIAGNOSIS — N941 Unspecified dyspareunia: Secondary | ICD-10-CM | POA: Diagnosis not present

## 2019-09-11 DIAGNOSIS — Z90710 Acquired absence of both cervix and uterus: Secondary | ICD-10-CM

## 2019-09-11 DIAGNOSIS — R232 Flushing: Secondary | ICD-10-CM | POA: Diagnosis not present

## 2019-09-11 DIAGNOSIS — Z01419 Encounter for gynecological examination (general) (routine) without abnormal findings: Secondary | ICD-10-CM | POA: Diagnosis not present

## 2019-09-11 DIAGNOSIS — Z1331 Encounter for screening for depression: Secondary | ICD-10-CM

## 2019-09-11 DIAGNOSIS — Z1339 Encounter for screening examination for other mental health and behavioral disorders: Secondary | ICD-10-CM

## 2019-09-11 NOTE — Progress Notes (Signed)
Routine Annual Gynecology Examination   PCP: Patient, No Pcp Per  Chief Complaint  Patient presents with  . Annual Exam   History of Present Illness: Patient is a 54 y.o. [id number] presents for annual exam. The patient has no complaints today.   Menopausal bleeding: denies  Menopausal symptoms: hot flashes, dyspareunia.  She has occasional hot flashes, mostly in the evening, especially after wine.  She also notes dyspareunia. She and her husband do not have frequent intercourse.  However, she notes a pain that is hard to describe.  She has no pain issues unless she attempts intercourse.   Breast symptoms: denies  Last pap smear: 3 years ago. Normal.   Last mammogram: 11/2018.  Result Normal   Last colonoscopy: up to date (through [facility])  Past Medical History:  Diagnosis Date  . Diverticula, colon 2015  . Hx of hysterectomy 2004   d/t abnl pap HGSIL  . IBS (irritable bowel syndrome)   . 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 Thyroid disease     Past Surgical History:  Procedure Laterality Date  . APPENDECTOMY  10/12/1993   LAP  . BREAST SURGERY Bilateral 2003   augmentation  . CERVICAL BIOPSY  W/ LOOP ELECTRODE EXCISION  03/26/2002   SEVERE DYSP  . COLONOSCOPY  2015   Dr. Rayann Heman  . TONSILLECTOMY  1975  . TOTAL VAGINAL HYSTERECTOMY  2002   [facility]    Prior to Admission medications   Medication Sig Start Date End Date Taking? Authorizing Provider  levothyroxine (SYNTHROID LEVOTHROID) 88 MCG tablet Take 88 mcg by mouth daily before breakfast.   Yes [provider]  Multiple Vitamin (MULTIVITAMIN) capsule Take by mouth.   Yes [provider]  PARoxetine (PAXIL) 10 MG tablet Take by mouth.   Yes [provider]  rosuvastatin (CRESTOR) 10 MG tablet TAKE 1 TABLET BY MOUTH ONCE DAILY 08/28/19  Yes [provider]    Allergies  Allergen Reactions  . Sulfa Antibiotics Hives and Rash    Obstetric History: [id number]   Social History   Socioeconomic History  . Marital status: Married    Spouse name: Not on file  . Number of children: 2  . Years of education: 27  . Highest education level: Not on file  Occupational History  . Not on file  Tobacco Use  . Smoking status: Never Smoker  . Smokeless tobacco: Never Used  Substance and Sexual Activity  . Alcohol use: No    Comment: OCC  . Drug use: No  . Sexual activity: Yes    Birth control/protection: Surgical  Other Topics Concern  . Not on file  Social History Narrative  . Not on file   Social Determinants of Health   Financial Resource Strain:   . Difficulty of Paying Living Expenses: Not on file  Food Insecurity:   . Worried About Charity fundraiser in the Last Year: Not on file  . Ran Out of Food in the Last Year: Not on file  Transportation Needs:   . Lack of Transportation (Medical): Not on file  . Lack of Transportation (Non-Medical): Not on file  Physical Activity:   . Days of Exercise per Week: Not on file  . Minutes of Exercise per Session: Not on file  Stress:   . Feeling of Stress : Not on file  Social Connections:   . Frequency of Communication with Friends and Family: Not on file  . Frequency of Social  Gatherings with Friends and Family: Not on file  . Attends Religious Services: Not on file  . Active Member of Clubs or Organizations: Not on file  . Attends Archivist Meetings: Not on file  . Marital Status: Not on file  Intimate Partner Violence:   . Fear of Current or Ex-Partner: Not on file  . Emotionally Abused: Not on file  . Physically Abused: Not on file  . Sexually Abused: Not on file    Family History  Problem Relation Age of Onset  . Cancer Father        unknown  . Hypertension Mother   . Diabetes Sister        TYPE 2  . Breast cancer Maternal Grandmother 60    Review of Systems  Constitutional: Negative.   HENT: Negative.   Eyes: Negative.   Respiratory: Negative.   Cardiovascular:  Negative.   Gastrointestinal: Negative.   Genitourinary: Negative.   Musculoskeletal: Negative.   Skin: Negative.   Neurological: Negative.   Psychiatric/Behavioral: Negative.      Physical Exam Vitals: BP 126/74   Ht 5\' 2"  (1.575 m)   Wt 148 lb (67.1 kg)   BMI 27.07 kg/m   Physical Exam Constitutional:      General: She is not in acute distress.    Appearance: Normal appearance. She is well-developed.  Genitourinary:     Pelvic exam was performed with patient in the lithotomy position.     Vulva, urethra and bladder normal.     No inguinal adenopathy present in the right or left side.    No signs of injury in the vagina.     Vaginal atrophy (mild) present.     No vaginal discharge, erythema, tenderness or bleeding.     Cervix is absent.     Uterus is absent.     No right or left adnexal mass present.     Right adnexa not tender or full.     Left adnexa not tender or full.  HENT:     Head: Normocephalic and atraumatic.  Eyes:     General: No scleral icterus.    Conjunctiva/sclera: Conjunctivae normal.  Neck:     Thyroid: No thyromegaly.  Cardiovascular:     Rate and Rhythm: Normal rate and regular rhythm.     Heart sounds: No murmur. No friction rub. No gallop.   Pulmonary:     Effort: Pulmonary effort is normal. No respiratory distress.     Breath sounds: Normal breath sounds. No wheezing or rales.  Chest:     Breasts:        Right: No inverted nipple, mass, nipple discharge, skin change or tenderness.        Left: No inverted nipple, mass, nipple discharge, skin change or tenderness.  Abdominal:     General: Bowel sounds are normal. There is no distension.     Palpations: Abdomen is soft. There is no mass.     Tenderness: There is no abdominal tenderness. There is no guarding or rebound.  Musculoskeletal:        General: No swelling or tenderness. Normal range of motion.     Cervical back: Normal range of motion and neck supple.  Lymphadenopathy:      Cervical: No cervical adenopathy.     Lower Body: No right inguinal adenopathy. No left inguinal adenopathy.  Neurological:     General: No focal deficit present.     Mental Status: She is alert and oriented  to person, place, and time.     Cranial Nerves: No cranial nerve deficit.  Skin:    General: Skin is warm and dry.     Findings: No erythema or rash.  Psychiatric:        Mood and Affect: Mood normal.        Behavior: Behavior normal.        Judgment: Judgment normal.      Female chaperone present for pelvic and breast  portions of the physical exam  Results: AUDIT Questionnaire (screen for alcoholism): 2 PHQ-9: 1   Assessment and Plan:  54 y.o. G62P2002 female here for routine annual gynecologic examination  Plan: Problem List Items Addressed This Visit    None    Visit Diagnoses    Women's annual routine gynecological examination    -  Primary   Screening for depression       Screening for alcoholism       Hot flashes       Relevant Medications   rosuvastatin (CRESTOR) 10 MG tablet   Female dyspareunia          Screening: -- Blood pressure screen normal -- Colonoscopy - not due -- Mammogram - due in May 2021 -- Weight screening: overweight: continue to monitor -- Depression screening negative (PHQ-9) -- Nutrition: normal -- cholesterol screening: per PCP -- osteoporosis screening: not due -- tobacco screening: not using -- alcohol screening: AUDIT questionnaire indicates low-risk usage. -- family history of breast cancer screening: done. not at high risk. -- no evidence of domestic violence or intimate partner violence. -- STD screening: gonorrhea/chlamydia NAAT not collected per patient request. -- pap smear not collected per ASCCP guidelines -- HPV vaccination series: not eligilbe   Hot flashes: Discussed continuing Paxil use.  Discussed considering oral or topical estrogen use at a very low dose.  A secondary benefit of this might be risk reduction  for osteoporosis or osteopenia.  Discussed the women's health initiative trial in this setting.  She would be a good overall candidate for a low-dose and short duration of estrogen therapy.  She would like to consider.  Dyspareunia: Discussed use of over-the-counter lubricants.  We did discuss topical estrogen, which may be of benefit.  However, given the infrequency of intercourse, the cost associated with the medication may not be worth the benefit.  However, I left this up to her calculation.  She will also consider this and let me know if she decides to go this route.  20 minutes spent in face to face discussion with > 50% spent in counseling,management, and coordination of care of her hot flashes and dyspareunia.  Prentice Docker, MD 09/11/2019 5:46 PM

## 2019-10-03 ENCOUNTER — Other Ambulatory Visit: Payer: Self-pay | Admitting: Obstetrics and Gynecology

## 2019-10-03 DIAGNOSIS — R232 Flushing: Secondary | ICD-10-CM

## 2019-10-03 MED ORDER — ESTRADIOL 0.0375 MG/24HR TD PTTW: 1.0000 | MEDICATED_PATCH | TRANSDERMAL | 12 refills | Status: DC

## 2020-01-15 ENCOUNTER — Ambulatory Visit (INDEPENDENT_AMBULATORY_CARE_PROVIDER_SITE_OTHER): Payer: 59 | Admitting: Dermatology

## 2020-01-15 ENCOUNTER — Encounter: Payer: Self-pay | Admitting: Dermatology

## 2020-01-15 ENCOUNTER — Other Ambulatory Visit: Payer: Self-pay

## 2020-01-15 DIAGNOSIS — L814 Other melanin hyperpigmentation: Secondary | ICD-10-CM

## 2020-01-15 DIAGNOSIS — L821 Other seborrheic keratosis: Secondary | ICD-10-CM | POA: Diagnosis not present

## 2020-01-15 DIAGNOSIS — Z1283 Encounter for screening for malignant neoplasm of skin: Secondary | ICD-10-CM

## 2020-01-15 DIAGNOSIS — D492 Neoplasm of unspecified behavior of bone, soft tissue, and skin: Secondary | ICD-10-CM

## 2020-01-15 DIAGNOSIS — D229 Melanocytic nevi, unspecified: Secondary | ICD-10-CM | POA: Diagnosis not present

## 2020-01-15 DIAGNOSIS — L988 Other specified disorders of the skin and subcutaneous tissue: Secondary | ICD-10-CM | POA: Diagnosis not present

## 2020-01-15 DIAGNOSIS — D225 Melanocytic nevi of trunk: Secondary | ICD-10-CM | POA: Diagnosis not present

## 2020-01-15 DIAGNOSIS — L578 Other skin changes due to chronic exposure to nonionizing radiation: Secondary | ICD-10-CM

## 2020-01-15 DIAGNOSIS — D239 Other benign neoplasm of skin, unspecified: Secondary | ICD-10-CM

## 2020-01-15 DIAGNOSIS — Z872 Personal history of diseases of the skin and subcutaneous tissue: Secondary | ICD-10-CM

## 2020-01-15 HISTORY — DX: Other benign neoplasm of skin, unspecified: D23.9

## 2020-01-15 MED ORDER — TRETINOIN 0.025 % EX CREA: TOPICAL_CREAM | Freq: Every evening | CUTANEOUS | 0 refills | Status: DC

## 2020-01-15 NOTE — Progress Notes (Signed)
   Follow-Up Visit   Subjective  Tiffany Miller is a 54 y.o. female who presents for the following: Annual Exam (Hx of AK right thigh. ) and Skin Problem (Lesion on right upper arm.). The patient presents for Total-Body Skin Exam (TBSE) for skin cancer screening and mole check.  The following portions of the chart were reviewed this encounter and updated as appropriate: Tobacco  Allergies  Meds  Problems  Med Hx  Surg Hx  Fam Hx     Objective  Well appearing patient in no apparent distress; mood and affect are within normal limits.  A full examination was performed including scalp, head, eyes, ears, nose, lips, neck, chest, axillae, abdomen, back, buttocks, bilateral upper extremities, bilateral lower extremities, hands, feet, fingers, toes, fingernails, and toenails. All findings within normal limits unless otherwise noted below.   Objective  Right Upper Arm - Posterior: Stuck-on, waxy, tan-brown papule or plaque --Discussed benign etiology and prognosis.   Objective  Left medial Inframammary: 0.6 x 0.3cm dark brown papule   Objective  Head - Anterior (Face): Rhytides and volume loss.   Assessment & Plan    Lentigines - Scattered tan macules - Discussed due to sun exposure - Benign, observe - Call for any changes  Seborrheic Keratoses - Stuck-on, waxy, tan-brown papules and plaques  - Discussed benign etiology and prognosis. - Observe - Call for any changes  Melanocytic Nevi - Tan-brown and/or pink-flesh-colored symmetric macules and papules - Benign appearing on exam today - Observation - Call clinic for new or changing moles - Recommend daily use of broad spectrum spf 30+ sunscreen to sun-exposed areas.   Hemangiomas - Red papules - Discussed benign nature - Observe - Call for any changes  Actinic Damage - diffuse scaly erythematous macules with underlying dyspigmentation - Recommend daily broad spectrum sunscreen SPF 30+ to sun-exposed areas,  reapply every 2 hours as needed.  - Call for new or changing lesions.  Skin cancer screening performed today.  Seborrheic keratosis Right Upper Arm - Posterior  Benign. Observe.  Neoplasm of skin Left medial Inframammary  Epidermal / dermal shaving  Lesion diameter (cm):  0.6 Informed consent: discussed and consent obtained   Anesthesia: the lesion was anesthetized in a standard fashion   Anesthetic:  1% lidocaine w/ epinephrine 1-100,000 buffered w/ 8.4% NaHCO3 Instrument used: flexible razor blade   Hemostasis achieved with: aluminum chloride   Hemostasis achieved with comment:  Electrocautery Outcome: patient tolerated procedure well   Post-procedure details: sterile dressing applied and wound care instructions given   Dressing type: bacitracin and bandage   Additional details:  Post treatment defect 1.0cm  Specimen 1 - Surgical pathology Differential Diagnosis: Nevus vs dysplastic nevus Check Margins: No 0.6 x 0.3cm dark brown papule  Elastosis of skin Head - Anterior (Face)  tretinoin (RETIN-A) 0.025 % cream - Head - Anterior (Face)  Skin cancer screening  Return in about 1 year (around 01/14/2021).   I, Emelia Salisbury, CMA, am acting as scribe for Sarina Ser, MD.  Documentation: I have reviewed the above documentation for accuracy and completeness, and I agree with the above.  Sarina Ser, MD

## 2020-01-15 NOTE — Patient Instructions (Signed)

## 2020-01-19 ENCOUNTER — Encounter: Payer: Self-pay | Admitting: Dermatology

## 2020-01-20 ENCOUNTER — Telehealth: Payer: Self-pay

## 2020-01-20 NOTE — Telephone Encounter (Signed)
Patient informed of results. Patient states that she has noticed some brown pigmentation at biopsy site. I advised her if not resolved, or brown pigmentation still present after 4-8 weeks she should RTC for shave removal.

## 2020-01-20 NOTE — Telephone Encounter (Signed)
-----   Message from Ralene Bathe, MD sent at 01/19/2020  3:01 PM EDT ----- Skin , left medial inframammary DYSPLASTIC JUNCTIONAL NEVUS WITH MILD ATYPIA, CLOSE TO MARGIN  Dsyplastic Mild Recheck next visit (make appt by 1 yr if not already scheduled.)

## 2020-04-28 ENCOUNTER — Other Ambulatory Visit: Payer: Self-pay | Admitting: Dermatology

## 2020-04-28 DIAGNOSIS — L988 Other specified disorders of the skin and subcutaneous tissue: Secondary | ICD-10-CM

## 2020-05-01 DIAGNOSIS — F411 Generalized anxiety disorder: Secondary | ICD-10-CM | POA: Insufficient documentation

## 2020-08-26 ENCOUNTER — Other Ambulatory Visit: Payer: Self-pay | Admitting: Obstetrics and Gynecology

## 2020-08-26 DIAGNOSIS — R232 Flushing: Secondary | ICD-10-CM

## 2020-09-15 ENCOUNTER — Ambulatory Visit (INDEPENDENT_AMBULATORY_CARE_PROVIDER_SITE_OTHER): Payer: 59 | Admitting: Obstetrics and Gynecology

## 2020-09-15 ENCOUNTER — Other Ambulatory Visit (HOSPITAL_COMMUNITY)
Admission: RE | Admit: 2020-09-15 | Discharge: 2020-09-15 | Disposition: A | Discharge status: Home / Self Care | Payer: 59 | Source: Ambulatory Visit | Attending: Obstetrics and Gynecology | Admitting: Obstetrics and Gynecology

## 2020-09-15 ENCOUNTER — Other Ambulatory Visit: Payer: Self-pay

## 2020-09-15 VITALS — BP 118/74 | Ht 62.0 in | Wt 155.0 lb

## 2020-09-15 DIAGNOSIS — Z01419 Encounter for gynecological examination (general) (routine) without abnormal findings: Secondary | ICD-10-CM | POA: Insufficient documentation

## 2020-09-15 DIAGNOSIS — Z1331 Encounter for screening for depression: Secondary | ICD-10-CM | POA: Diagnosis not present

## 2020-09-15 DIAGNOSIS — R232 Flushing: Secondary | ICD-10-CM

## 2020-09-15 DIAGNOSIS — Z1339 Encounter for screening examination for other mental health and behavioral disorders: Secondary | ICD-10-CM | POA: Diagnosis not present

## 2020-09-15 DIAGNOSIS — R87613 High grade squamous intraepithelial lesion on cytologic smear of cervix (HGSIL): Secondary | ICD-10-CM | POA: Diagnosis present

## 2020-09-15 NOTE — Progress Notes (Signed)
Routine Annual Gynecology Examination   PCP: Rusty Aus, MD  Chief Complaint  Patient presents with  . Annual Exam   History of Present Illness: Patient is a 55 y.o. G2P2002 presents for annual exam. The patient has no complaints today.   Menopausal bleeding: denies  Menopausal symptoms: big improvement in hot flashes and other symptoms.  Even dyspareunia is improved.   Breast symptoms: denies  Last pap smear: 4 years ago.  Result Normal  Last mammogram: 1 years ago.  Result Normal   Past Medical History:  Diagnosis Date  . Actinic keratosis   . Diverticula, colon 2015  . Hx of hysterectomy 2004   d/t abnl pap HGSIL  . IBS (irritable bowel syndrome)   . 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 Thyroid disease     Past Surgical History:  Procedure Laterality Date  . APPENDECTOMY  10/12/1993   LAP  . BREAST SURGERY Bilateral 2003   augmentation  . CERVICAL BIOPSY  W/ LOOP ELECTRODE EXCISION  03/26/2002   SEVERE DYSP  . COLONOSCOPY  2015   Dr. Rayann Heman  . TONSILLECTOMY  1975  . TOTAL VAGINAL HYSTERECTOMY  2002   [facility]    Prior to Admission medications   Medication Sig Start Date End Date Taking? Authorizing Provider  cholecalciferol (VITAMIN D) 25 MCG (1000 UNIT) tablet Take by mouth.   Yes [provider]  estradiol (MINIVELLE) 0.0375 MG/24HR Place 1 patch onto the skin 2 (two) times a week. 10/03/19  Yes Will Bonnet, MD  levothyroxine (SYNTHROID) 88 MCG tablet Take 88 mcg by mouth daily. 08/28/19  Yes [provider]  Multiple Vitamin (MULTIVITAMIN) capsule Take by mouth.   Yes [provider]  PARoxetine (PAXIL) 10 MG tablet Take by mouth.   Yes [provider]  rosuvastatin (CRESTOR) 10 MG tablet TAKE 1 TABLET BY MOUTH ONCE DAILY 08/28/19  Yes [provider]  tretinoin (RETIN-A) 0.025 % cream APPLY TOPICALLY AT BEDTIME 04/28/20 04/28/21 Yes Ralene Bathe, MD    Allergies  Allergen Reactions   . Sulfa Antibiotics Hives and Rash    Obstetric History: [id number]  Social History   Socioeconomic History  . Marital status: Married    Spouse name: Not on file  . Number of children: 2  . Years of education: 26  . Highest education level: Not on file  Occupational History  . Not on file  Tobacco Use  . Smoking status: Never Smoker  . Smokeless tobacco: Never Used  Vaping Use  . Vaping Use: Never used  Substance and Sexual Activity  . Alcohol use: No    Comment: OCC  . Drug use: No  . Sexual activity: Yes    Birth control/protection: Surgical  Other Topics Concern  . Not on file  Social History Narrative  . Not on file   Social Determinants of Health   Financial Resource Strain: Not on file  Food Insecurity: Not on file  Transportation Needs: Not on file  Physical Activity: Not on file  Stress: Not on file  Social Connections: Not on file  Intimate Partner Violence: Not on file    Family History  Problem Relation Age of Onset  . Cancer Father        unknown  . Hypertension Mother   . Diabetes Sister        TYPE 2  . Breast cancer Maternal Grandmother 60    Review of Systems  Constitutional: Negative.  HENT: Negative.   Eyes: Negative.   Respiratory: Negative.   Cardiovascular: Negative.   Gastrointestinal: Negative.   Genitourinary: Negative.   Musculoskeletal: Negative.   Skin: Negative.   Neurological: Negative.   Psychiatric/Behavioral: Negative.      Physical Exam Vitals: BP 118/74   Ht 5\' 2"  (1.575 m)   Wt 155 lb (70.3 kg)   BMI 28.35 kg/m   Physical Exam Constitutional:      General: She is in acute distress (pain with breathing).     Appearance: Normal appearance. She is well-developed. She is not diaphoretic.  Genitourinary:     Vulva and bladder normal.     Right Labia: No rash, tenderness, lesions, skin changes or Bartholin's cyst.    Left Labia: No tenderness, lesions, skin changes, Bartholin's cyst or rash.    No inguinal  adenopathy present in the right or left side.    Pelvic Tanner Score: 5/5.    No vaginal discharge, erythema, tenderness or bleeding.      Right Adnexa: not tender, not full and no mass present.    Left Adnexa: not tender, not full and no mass present.    No cervical motion tenderness, discharge, lesion or polyp.     Uterus is not enlarged or tender.     No uterine mass detected.    Pelvic exam was performed with patient in the lithotomy position.  Breasts:     Right: No inverted nipple, mass, nipple discharge, skin change or tenderness.     Left: No inverted nipple, mass, nipple discharge, skin change or tenderness.    HENT:     Head: Normocephalic and atraumatic.  Eyes:     General: No scleral icterus.    Conjunctiva/sclera: Conjunctivae normal.  Neck:     Thyroid: No thyromegaly.  Cardiovascular:     Rate and Rhythm: Normal rate and regular rhythm.     Heart sounds: No murmur heard. No friction rub. No gallop.   Pulmonary:     Effort: Pulmonary effort is normal. No respiratory distress.     Breath sounds: Normal breath sounds. No wheezing or rales.  Abdominal:     General: Bowel sounds are normal. There is no distension.     Palpations: Abdomen is soft. There is no mass.     Tenderness: There is no abdominal tenderness. There is no guarding or rebound.     Hernia: There is no hernia in the left inguinal area or right inguinal area.  Musculoskeletal:        General: No swelling or tenderness. Normal range of motion.     Cervical back: Normal range of motion and neck supple.  Lymphadenopathy:     Cervical: No cervical adenopathy.     Lower Body: No right inguinal adenopathy. No left inguinal adenopathy.  Neurological:     General: No focal deficit present.     Mental Status: She is alert and oriented to person, place, and time.     Cranial Nerves: No cranial nerve deficit.  Skin:    General: Skin is warm and dry.     Findings: No erythema or rash.  Psychiatric:         Mood and Affect: Mood normal.        Behavior: Behavior normal.        Judgment: Judgment normal.      Female chaperone present for pelvic and breast  portions of the physical exam  Results: AUDIT Questionnaire (screen for alcoholism): 2 PHQ-9:  2   Assessment and Plan:  55 y.o. G84P2002 female here for routine annual gynecologic examination  Plan: Problem List Items Addressed This Visit   None   Visit Diagnoses    Women's annual routine gynecological examination    -  Primary   Relevant Medications   estradiol (MINIVELLE) 0.0375 MG/24HR (Start on 09/17/2020)   Other Relevant Orders   Cytology - PAP   Screening for depression       Screening for alcoholism       HGSIL (high grade squamous intraepithelial lesion) on Pap smear of cervix       Relevant Orders   Cytology - PAP   Hot flashes       Relevant Medications   estradiol (MINIVELLE) 0.0375 MG/24HR (Start on 09/17/2020)      Screening: -- Blood pressure screen normal -- Colonoscopy - due - managed by PCP -- Mammogram - due. Patient to call [facility] to arrange. She understands that it is her responsibility to arrange this. -- Weight screening: normal -- Depression screening negative (PHQ-9) -- Nutrition: normal -- cholesterol screening: per PCP -- osteoporosis screening: not due -- tobacco screening: not using -- alcohol screening: AUDIT questionnaire indicates low-risk usage. -- family history of breast cancer screening: done. not at high risk. -- no evidence of domestic violence or intimate partner violence. -- STD screening: gonorrhea/chlamydia NAAT not collected per patient request. -- pap smear collected per ASCCP guidelines  Hot flahshes:  may d/c paxil Chest pain:  Likely MSK. Though she is in quite a bit of pain. My recommendation was for her to go to the ER to be evaluated.  No diaphoresis, SOB, nml breath sounds, obvious pain.  Right sided.   If pap smear normal, will not have to repeat  again.  Prentice Docker, MD 09/15/2020 1:45 PM

## 2020-09-16 ENCOUNTER — Encounter: Payer: Self-pay | Admitting: Obstetrics and Gynecology

## 2020-09-16 MED ORDER — ESTRADIOL 0.0375 MG/24HR TD PTTW: 1.0000 | MEDICATED_PATCH | TRANSDERMAL | 12 refills | Status: AC

## 2020-09-17 LAB — CYTOLOGY - PAP
Comment: NEGATIVE
Diagnosis: NEGATIVE
High risk HPV: NEGATIVE

## 2020-09-27 IMAGING — DX DG FINGER MIDDLE 2+V*R*
3 series · 3 of 3 positions shown · non-contrast
Comparison: None.

CLINICAL DATA: Crushed middle finger, pain.

EXAM:
RIGHT MIDDLE FINGER 2+V

[finger ap]
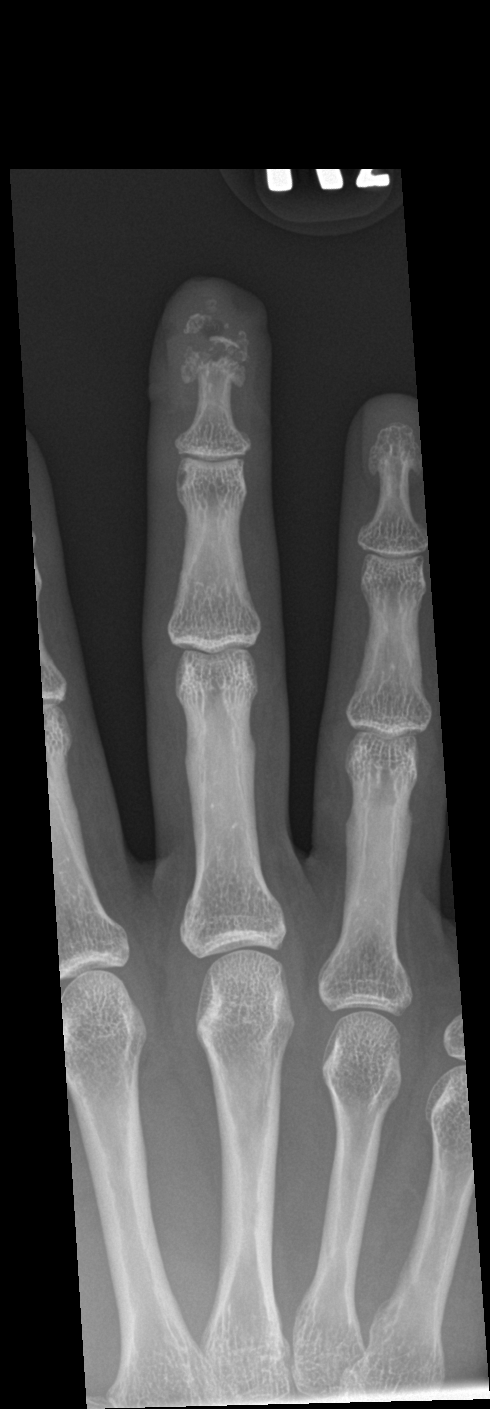

[finger obl]
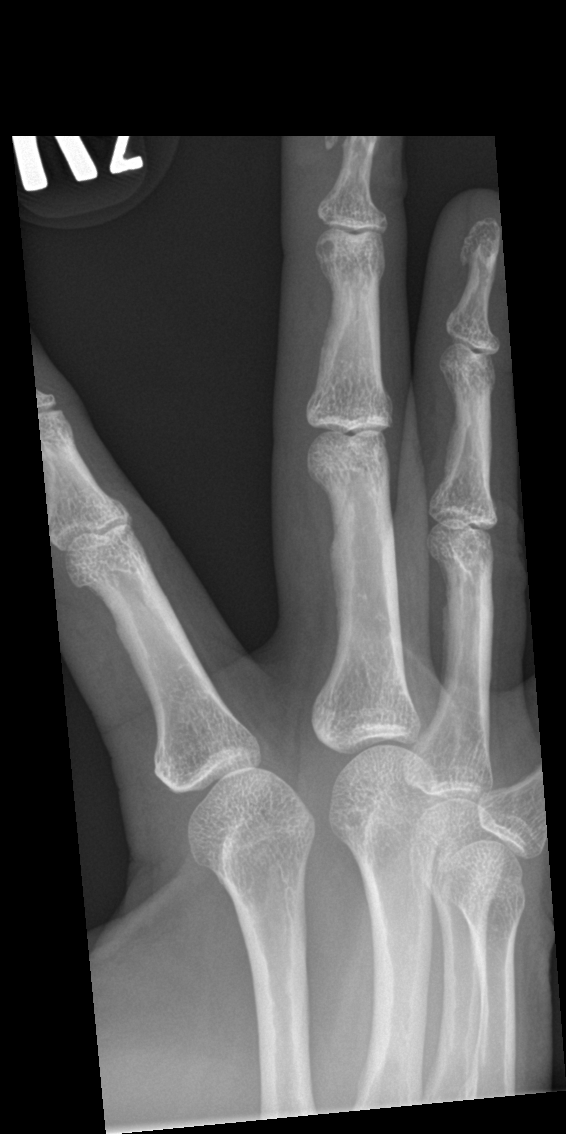

[finger lat]
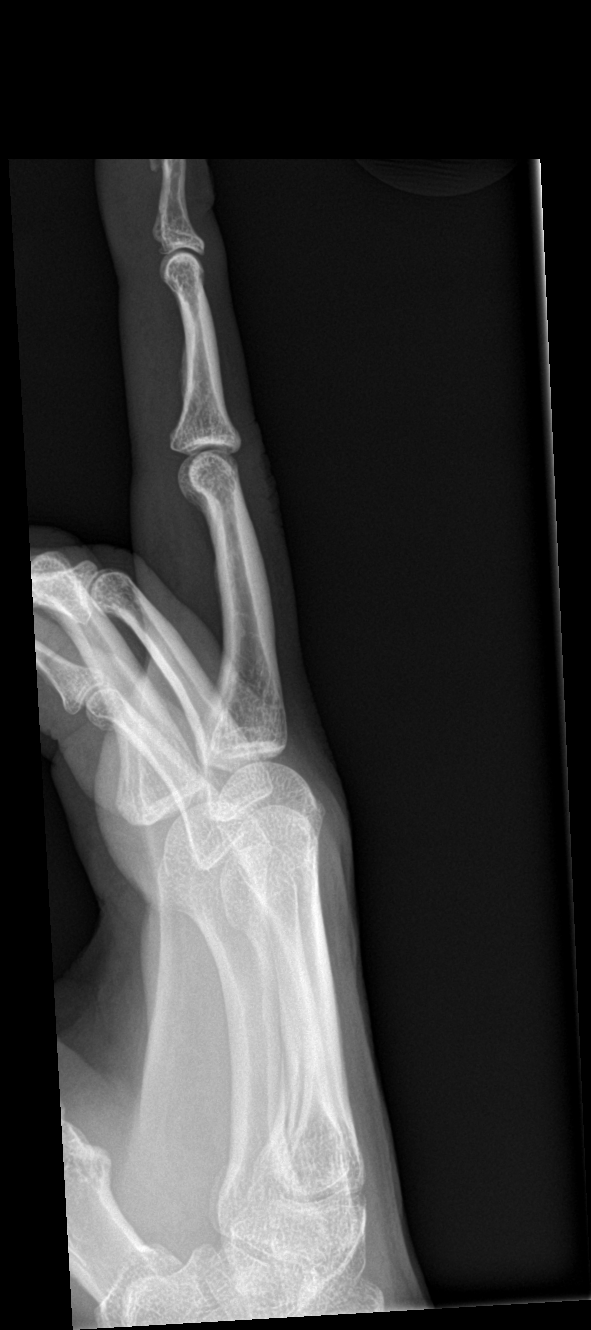

[3 of 3 positions shown; findings below may reference images not displayed]

FINDINGS: Three views of the right middle finger with highly comminuted
tuft/distal phalangeal fracture with distortion of soft tissues
overlying the distal phalanx. Lucency overlying the dorsum of the
fracture site deep to the nail bed.
IMPRESSION: Highly comminuted tuft/distal phalangeal fracture with underlying
soft tissue swelling. No signs of foreign body. Small amount of gas
in the region of the nailbed suspicious for open fracture with nail
bed injury. Clinical correlation suggested.

## 2021-01-25 ENCOUNTER — Other Ambulatory Visit: Payer: Self-pay

## 2021-01-25 ENCOUNTER — Ambulatory Visit (INDEPENDENT_AMBULATORY_CARE_PROVIDER_SITE_OTHER): Payer: 59 | Admitting: Dermatology

## 2021-01-25 DIAGNOSIS — Z1283 Encounter for screening for malignant neoplasm of skin: Secondary | ICD-10-CM | POA: Diagnosis not present

## 2021-01-25 DIAGNOSIS — D229 Melanocytic nevi, unspecified: Secondary | ICD-10-CM

## 2021-01-25 DIAGNOSIS — L814 Other melanin hyperpigmentation: Secondary | ICD-10-CM | POA: Diagnosis not present

## 2021-01-25 DIAGNOSIS — L409 Psoriasis, unspecified: Secondary | ICD-10-CM

## 2021-01-25 DIAGNOSIS — L988 Other specified disorders of the skin and subcutaneous tissue: Secondary | ICD-10-CM | POA: Diagnosis not present

## 2021-01-25 DIAGNOSIS — Z86018 Personal history of other benign neoplasm: Secondary | ICD-10-CM

## 2021-01-25 DIAGNOSIS — D1800 Hemangioma unspecified site: Secondary | ICD-10-CM

## 2021-01-25 DIAGNOSIS — L821 Other seborrheic keratosis: Secondary | ICD-10-CM

## 2021-01-25 DIAGNOSIS — L578 Other skin changes due to chronic exposure to nonionizing radiation: Secondary | ICD-10-CM

## 2021-01-25 MED ORDER — TRETINOIN 0.025 % EX CREA
TOPICAL_CREAM | Freq: Every evening | CUTANEOUS | 5 refills | Status: AC
Start: 2021-01-25 — End: 2022-01-25

## 2021-01-25 MED ORDER — WYNZORA 0.005-0.064 % EX CREA: TOPICAL_CREAM | CUTANEOUS | 2 refills | Status: AC

## 2021-01-25 NOTE — Patient Instructions (Addendum)
Recommend daily broad spectrum sunscreen SPF 30+ to sun-exposed areas, reapply every 2 hours as needed. Call for new or changing lesions.  Staying in the shade or wearing long sleeves, sun glasses (UVA+UVB protection) and wide brim hats (4-inch brim around the entire circumference of the hat) are also recommended for sun protection.   If you have any questions or concerns for your doctor, please call our main line at [phone] and press option 4 to reach your doctor's medical assistant. If no one answers, please leave a voicemail as directed and we will return your call as soon as possible. Messages left after 4 pm will be answered the following business day.   You may also send us a message via MyChart. We typically respond to MyChart messages within 1-2 business days.  For prescription refills, please ask your pharmacy to contact our office. Our fax number is [phone].  If you have an urgent issue when the clinic is closed that cannot wait until the next business day, you can page your doctor at the number below.    Please note that while we do our best to be available for urgent issues outside of office hours, we are not available 24/7.   If you have an urgent issue and are unable to reach us, you may choose to seek medical care at your doctor's office, retail clinic, urgent care center, or emergency room.  If you have a medical emergency, please immediately call 911 or go to the emergency department.  Pager Numbers  - Dr. Kowalski: [phone]  - Dr. Moye: [phone]  - Dr. Stewart: [phone]  In the event of inclement weather, please call our main line at [phone] for an update on the status of any delays or closures.  Dermatology Medication Tips: Please keep the boxes that topical medications come in in order to help keep track of the instructions about where and how to use these. Pharmacies typically print the medication instructions only on the boxes and not  directly on the medication tubes.   If your medication is too expensive, please contact our office at [phone] option 4 or send us a message through MyChart.   We are unable to tell what your co-pay for medications will be in advance as this is different depending on your insurance coverage. However, we may be able to find a substitute medication at lower cost or fill out paperwork to get insurance to cover a needed medication.   If a prior authorization is required to get your medication covered by your insurance company, please allow us 1-2 business days to complete this process.  Drug prices often vary depending on where the prescription is filled and some pharmacies may offer cheaper prices.  The website www.goodrx.com contains coupons for medications through different pharmacies. The prices here do not account for what the cost may be with help from insurance (it may be cheaper with your insurance), but the website can give you the price if you did not use any insurance.  - You can print the associated coupon and take it with your prescription to the pharmacy.  - You may also stop by our office during regular business hours and pick up a GoodRx coupon card.  - If you need your prescription sent electronically to a different pharmacy, notify our office through [facility] MyChart or by phone at [phone] option 4.  

## 2021-01-25 NOTE — Progress Notes (Signed)
Follow-Up Visit   Subjective  Tiffany Miller is a 55 y.o. female who presents for the following: Annual Exam (Mole check, hx of Dysplastic nevus ). Pt c/o rash on the palm of hands, pt mom has a history of Psoriasis  The patient presents for Total-Body Skin Exam (TBSE) for skin cancer screening and mole check.   The following portions of the chart were reviewed this encounter and updated as appropriate:   Tobacco  Allergies  Meds  Problems  Med Hx  Surg Hx  Fam Hx     Review of Systems:  No other skin or systemic complaints except as noted in HPI or Assessment and Plan.  Objective  Well appearing patient in no apparent distress; mood and affect are within normal limits.  A full examination was performed including scalp, head, eyes, ears, nose, lips, neck, chest, axillae, abdomen, back, buttocks, bilateral upper extremities, bilateral lower extremities, hands, feet, fingers, toes, fingernails, and toenails. All findings within normal limits unless otherwise noted below.  left palm of hand, right mid sole Pinkness and scalp  face Rhytides and volume loss.    Assessment & Plan  Psoriasis left palm of hand, right mid sole  Suspect Psoriasis due to strong family hx of Psoriasis  Psoriasis is a chronic non-curable, but treatable genetic/hereditary disease that may have other systemic features affecting other organ systems such as joints (Psoriatic Arthritis). It is associated with an increased risk of inflammatory bowel disease, heart disease, no non-alcoholic fatty liver disease, and depression.     Start Delos Haring apply to skin once or twice a day   Related Medications Calcipotriene-Betameth [facility]) 0.005-0.064 % CREA Apply to affected areas on the skin once or twice a day  Elastosis of skin face  Cont Retin A .025% cream apply to face at bedtime   Related Medications tretinoin (RETIN-A) 0.025 % cream Apply topically at bedtime.  Skin cancer  screening  Lentigines - Scattered tan macules - Due to sun exposure - Benign-appering, observe - Recommend daily broad spectrum sunscreen SPF 30+ to sun-exposed areas, reapply every 2 hours as needed. - Call for any changes  Seborrheic Keratoses - Stuck-on, waxy, tan-brown papules and/or plaques  - Benign-appearing - Discussed benign etiology and prognosis. - Observe - Call for any changes  Melanocytic Nevi - Tan-brown and/or pink-flesh-colored symmetric macules and papules - Benign appearing on exam today - Observation - Call clinic for new or changing moles - Recommend daily use of broad spectrum spf 30+ sunscreen to sun-exposed areas.   Hemangiomas - Red papules - Discussed benign nature - Observe - Call for any changes  Actinic Damage - Chronic condition, secondary to cumulative UV/sun exposure - diffuse scaly erythematous macules with underlying dyspigmentation - Recommend daily broad spectrum sunscreen SPF 30+ to sun-exposed areas, reapply every 2 hours as needed.  - Staying in the shade or wearing long sleeves, sun glasses (UVA+UVB protection) and wide brim hats (4-inch brim around the entire circumference of the hat) are also recommended for sun protection.  - Call for new or changing lesions.  History of Dysplastic Nevi Left med inframammary  - No evidence of recurrence today - Recommend regular full body skin exams - Recommend daily broad spectrum sunscreen SPF 30+ to sun-exposed areas, reapply every 2 hours as needed.  - Call if any new or changing lesions are noted between office visits  Skin cancer screening performed today.   Return in about 1 year (around 01/25/2022) for TBSE, hx of  Dysplastic nevus, Hx of Aks .  IMarye Round, CMA, am acting as scribe for Sarina Ser, MD .  Documentation: I have reviewed the above documentation for accuracy and completeness, and I agree with the above.  Sarina Ser, MD

## 2021-01-26 ENCOUNTER — Encounter: Payer: Self-pay | Admitting: Dermatology

## 2021-04-15 DIAGNOSIS — E538 Deficiency of other specified B group vitamins: Secondary | ICD-10-CM | POA: Insufficient documentation

## 2022-01-31 ENCOUNTER — Ambulatory Visit: Payer: 59 | Admitting: Dermatology

## 2022-03-31 ENCOUNTER — Other Ambulatory Visit: Payer: Self-pay | Admitting: Dermatology

## 2022-03-31 DIAGNOSIS — L988 Other specified disorders of the skin and subcutaneous tissue: Secondary | ICD-10-CM

## 2022-04-18 DIAGNOSIS — Z8052 Family history of malignant neoplasm of bladder: Secondary | ICD-10-CM | POA: Insufficient documentation

## 2022-05-11 ENCOUNTER — Ambulatory Visit (INDEPENDENT_AMBULATORY_CARE_PROVIDER_SITE_OTHER): Payer: 59 | Admitting: Dermatology

## 2022-05-11 DIAGNOSIS — Z1283 Encounter for screening for malignant neoplasm of skin: Secondary | ICD-10-CM | POA: Diagnosis not present

## 2022-05-11 DIAGNOSIS — L578 Other skin changes due to chronic exposure to nonionizing radiation: Secondary | ICD-10-CM

## 2022-05-11 DIAGNOSIS — L650 Telogen effluvium: Secondary | ICD-10-CM | POA: Diagnosis not present

## 2022-05-11 DIAGNOSIS — L814 Other melanin hyperpigmentation: Secondary | ICD-10-CM | POA: Diagnosis not present

## 2022-05-11 DIAGNOSIS — L821 Other seborrheic keratosis: Secondary | ICD-10-CM

## 2022-05-11 DIAGNOSIS — D229 Melanocytic nevi, unspecified: Secondary | ICD-10-CM

## 2022-05-11 DIAGNOSIS — Z86018 Personal history of other benign neoplasm: Secondary | ICD-10-CM

## 2022-05-11 DIAGNOSIS — L853 Xerosis cutis: Secondary | ICD-10-CM

## 2022-05-11 MED ORDER — TRETINOIN 0.025 % EX CREA: TOPICAL_CREAM | Freq: Every day | CUTANEOUS | 11 refills | Status: AC

## 2022-05-11 NOTE — Patient Instructions (Addendum)
Telogen effluvium is a benign, self-limited condition causing increased hair shedding usually for several months. It does not progress to baldness, and the hair eventually grows back on its own. It can be triggered by recent illness, recent surgery, thyroid disease, low iron stores, vitamin D deficiency, fad diets or rapid weight loss, hormonal changes such as pregnancy or birth control pills, and some medication. Usually the hair loss starts 2-3 months after the illness or health change. Rarely, it can continue for longer than a year.   Start over the counter biotin 2.5 mg daily    Due to recent changes in healthcare laws, you may see results of your pathology and/or laboratory studies on MyChart before the doctors have had a chance to review them. We understand that in some cases there may be results that are confusing or concerning to you. Please understand that not all results are received at the same time and often the doctors may need to interpret multiple results in order to provide you with the best plan of care or course of treatment. Therefore, we ask that you please give [place] 2 business days to thoroughly review all your results before contacting the office for clarification. Should we see a critical lab result, you will be contacted sooner.   If You Need Anything After Your Visit  If you have any questions or concerns for your doctor, please call our main line at [phone] and press option 4 to reach your doctor's medical assistant. If no one answers, please leave a voicemail as directed and we will return your call as soon as possible. Messages left after 4 pm will be answered the following business day.   You may also send [place] a message via [place]. We typically respond to MyChart messages within 1-2 business days.  For prescription refills, please ask your pharmacy to contact our office. Our fax number is [phone].  If you have an urgent issue when the clinic is closed that cannot  wait until the next business day, you can page your doctor at the number below.    Please note that while we do our best to be available for urgent issues outside of office hours, we are not available 24/7.   If you have an urgent issue and are unable to reach [place], you may choose to seek medical care at your doctor's office, retail clinic, urgent care center, or emergency room.  If you have a medical emergency, please immediately call 911 or go to the emergency department.  Pager Numbers  - Dr. Nehemiah Massed: [phone]  - Dr. Laurence Ferrari: [phone]  - Dr. Nicole Kindred: [phone]  In the event of inclement weather, please call our main line at [phone] for an update on the status of any delays or closures.  Dermatology Medication Tips: Please keep the boxes that topical medications come in in order to help keep track of the instructions about where and how to use these. Pharmacies typically print the medication instructions only on the boxes and not directly on the medication tubes.   If your medication is too expensive, please contact our office at [phone] option 4 or send [place] a message through [place].   We are unable to tell what your co-pay for medications will be in advance as this is different depending on your insurance coverage. However, we may be able to find a substitute medication at lower cost or fill out paperwork to get insurance to cover a needed medication.   If a prior authorization is  required to get your medication covered by your insurance company, please allow us 1-2 business days to complete this process.  Drug prices often vary depending on where the prescription is filled and some pharmacies may offer cheaper prices.  The website www.goodrx.com contains coupons for medications through different pharmacies. The prices here do not account for what the cost may be with help from insurance (it may be cheaper with your insurance), but the website can give you the price  if you did not use any insurance.  - You can print the associated coupon and take it with your prescription to the pharmacy.  - You may also stop by our office during regular business hours and pick up a GoodRx coupon card.  - If you need your prescription sent electronically to a different pharmacy, notify our office through [facility] MyChart or by phone at [phone] option 4.     Si Usted Necesita Algo Despus de Su Visita  Tambin puede enviarnos un mensaje a travs de MyChart. Por lo general respondemos a los mensajes de MyChart en el transcurso de 1 a 2 das hbiles.  Para renovar recetas, por favor pida a su farmacia que se ponga en contacto con nuestra oficina. Nuestro nmero de fax es el [phone].  Si tiene un asunto urgente cuando la clnica est cerrada y que no puede esperar hasta el siguiente da hbil, puede llamar/localizar a su doctor(a) al nmero que aparece a continuacin.   Por favor, tenga en cuenta que aunque hacemos todo lo posible para estar disponibles para asuntos urgentes fuera del horario de oficina, no estamos disponibles las 24 horas del da, los 7 das de la semana.   Si tiene un problema urgente y no puede comunicarse con nosotros, puede optar por buscar atencin mdica  en el consultorio de su doctor(a), en una clnica privada, en un centro de atencin urgente o en una sala de emergencias.  Si tiene una emergencia mdica, por favor llame inmediatamente al 911 o vaya a la sala de emergencias.  Nmeros de bper  - Dr. Kowalski: [phone]  - Dra. Moye: [phone]  - Dra. Stewart: [phone]  En caso de inclemencias del tiempo, por favor llame a nuestra lnea principal al [phone] para una actualizacin sobre el estado de cualquier retraso o cierre.  Consejos para la medicacin en dermatologa: Por favor, guarde las cajas en las que vienen los medicamentos de uso tpico para ayudarle a seguir las instrucciones sobre dnde y cmo usarlos.  Las farmacias generalmente imprimen las instrucciones del medicamento slo en las cajas y no directamente en los tubos del medicamento.   Si su medicamento es muy caro, por favor, pngase en contacto con nuestra oficina llamando al [phone] y presione la opcin 4 o envenos un mensaje a travs de MyChart.   No podemos decirle cul ser su copago por los medicamentos por adelantado ya que esto es diferente dependiendo de la cobertura de su seguro. Sin embargo, es posible que podamos encontrar un medicamento sustituto a menor costo o llenar un formulario para que el seguro cubra el medicamento que se considera necesario.   Si se requiere una autorizacin previa para que su compaa de seguros cubra su medicamento, por favor permtanos de 1 a 2 das hbiles para completar este proceso.  Los precios de los medicamentos varan con frecuencia dependiendo del lugar de dnde se surte la receta y alguna farmacias pueden ofrecer precios ms baratos.  El sitio web www.goodrx.com tiene cupones para medicamentos de   diferentes farmacias. Los precios aqu no tienen en cuenta lo que podra costar con la ayuda del seguro (puede ser ms barato con su seguro), pero el sitio web puede darle el precio si no utiliz Research scientist (physical sciences).  - Puede imprimir el cupn correspondiente y llevarlo con su receta a la farmacia.  - Tambin puede pasar por nuestra oficina durante el horario de atencin regular y Charity fundraiser una tarjeta de cupones de GoodRx.  - Si necesita que su receta se enve electrnicamente a una farmacia diferente, informe a nuestra oficina a travs de MyChart de [place] o por telfono llamando al [phone] y presione la opcin 4.

## 2022-05-11 NOTE — Progress Notes (Signed)
Follow-Up Visit   Subjective  Tiffany Miller is a 56 y.o. female who presents for the following: Annual Exam (Mole check ). The patient presents for Total-Body Skin Exam (TBSE) for skin cancer screening and mole check.  The patient has spots, moles and lesions to be evaluated, some may be new or changing and the patient has concerns that these could be cancer.  Hx of Dysplastic nevus. Patient c/o hair shedding for the last 1 year.  The following portions of the chart were reviewed this encounter and updated as appropriate:   Tobacco  Allergies  Meds  Problems  Med Hx  Surg Hx  Fam Hx     Review of Systems:  No other skin or systemic complaints except as noted in HPI or Assessment and Plan.  Objective  Well appearing patient in no apparent distress; mood and affect are within normal limits.  A full examination was performed including scalp, head, eyes, ears, nose, lips, neck, chest, axillae, abdomen, back, buttocks, bilateral upper extremities, bilateral lower extremities, hands, feet, fingers, toes, fingernails, and toenails. All findings within normal limits unless otherwise noted below.  scalp Diffuse thinning of hair   Assessment & Plan  Telogen effluvium scalp Telogen effluvium possible due to Ozempic weight loss  Telogen effluvium is a benign, self-limited condition causing increased hair shedding usually for several months. It does not progress to baldness, and the hair eventually grows back on its own. It can be triggered by recent illness, recent surgery, thyroid disease, low iron stores, vitamin D deficiency, fad diets or rapid weight loss, hormonal changes such as pregnancy or birth control pills, and some medication. Usually the hair loss starts 2-3 months after the illness or health change. Rarely, it can continue for longer than a year.   Patient declines oral minoxidil treatment.  May consider topical minoxidil treatment.   Red light treatment is an option.    Start Biotin 2.5 mg take 1 tablet  Patient with Hashimoto's thyroiditis controlled on medications. Discussed with patient stop Biotin a few week before TSH labs   Related Medications tretinoin (RETIN-A) 0.025 % cream Apply topically at bedtime.  Lentigines - Scattered tan macules - Due to sun exposure - Benign-appearing, observe - Recommend daily broad spectrum sunscreen SPF 30+ to sun-exposed areas, reapply every 2 hours as needed. - Call for any changes  Seborrheic Keratoses - Stuck-on, waxy, tan-brown papules and/or plaques  - Benign-appearing - Discussed benign etiology and prognosis. - Observe - Call for any changes  Melanocytic Nevi - Tan-brown and/or pink-flesh-colored symmetric macules and papules - Benign appearing on exam today - Observation - Call clinic for new or changing moles - Recommend daily use of broad spectrum spf 30+ sunscreen to sun-exposed areas.   Hemangiomas - Red papules - Discussed benign nature - Observe - Call for any changes  Actinic Damage - Chronic condition, secondary to cumulative UV/sun exposure - diffuse scaly erythematous macules with underlying dyspigmentation - Recommend daily broad spectrum sunscreen SPF 30+ to sun-exposed areas, reapply every 2 hours as needed.  - Staying in the shade or wearing long sleeves, sun glasses (UVA+UVB protection) and wide brim hats (4-inch brim around the entire circumference of the hat) are also recommended for sun protection.  - Call for new or changing lesions.  Xerosis-mild psoriasis  Feet  Start otc Amlactin rapid relief  - diffuse xerotic patches - recommend gentle, hydrating skin care - gentle skin care handout given   History of Dysplastic Nevi Left  medial inframammary  - No evidence of recurrence today - Recommend regular full body skin exams - Recommend daily broad spectrum sunscreen SPF 30+ to sun-exposed areas, reapply every 2 hours as needed.  - Call if any new or changing  lesions are noted between office visits   Skin cancer screening performed today.   Return in about 1 year (around 05/12/2023) for TBSE, hx of Dysplastic nevus .  IMarye Round, CMA, am acting as scribe for Sarina Ser, MD .  Documentation: I have reviewed the above documentation for accuracy and completeness, and I agree with the above.  Sarina Ser, MD

## 2022-05-20 ENCOUNTER — Encounter: Payer: Self-pay | Admitting: Dermatology

## 2023-02-14 ENCOUNTER — Other Ambulatory Visit: Payer: Self-pay | Admitting: Obstetrics and Gynecology

## 2023-02-14 DIAGNOSIS — Z1231 Encounter for screening mammogram for malignant neoplasm of breast: Secondary | ICD-10-CM

## 2023-05-09 ENCOUNTER — Ambulatory Visit
Admission: RE | Admit: 2023-05-09 | Discharge: 2023-05-09 | Disposition: A | Discharge status: Home / Self Care | Payer: 59 | Source: Ambulatory Visit | Attending: Internal Medicine | Admitting: Internal Medicine

## 2023-05-09 ENCOUNTER — Other Ambulatory Visit: Payer: Self-pay | Admitting: Internal Medicine

## 2023-05-09 DIAGNOSIS — R100 Acute abdomen: Secondary | ICD-10-CM | POA: Insufficient documentation

## 2023-05-09 DIAGNOSIS — R1032 Left lower quadrant pain: Secondary | ICD-10-CM | POA: Insufficient documentation

## 2023-05-09 MED ORDER — IOHEXOL 300 MG/ML  SOLN
100.0000 mL | Freq: Once | INTRAMUSCULAR | Status: AC | PRN
  Administered 2023-05-09: 100 mL via INTRAVENOUS

## 2023-05-10 ENCOUNTER — Encounter: Payer: Self-pay | Admitting: Internal Medicine

## 2023-05-10 DIAGNOSIS — R100 Acute abdomen: Secondary | ICD-10-CM

## 2023-05-10 DIAGNOSIS — R1032 Left lower quadrant pain: Secondary | ICD-10-CM

## 2023-05-17 ENCOUNTER — Ambulatory Visit: Payer: 59 | Admitting: Dermatology

## 2023-05-17 DIAGNOSIS — L578 Other skin changes due to chronic exposure to nonionizing radiation: Secondary | ICD-10-CM

## 2023-05-17 DIAGNOSIS — L988 Other specified disorders of the skin and subcutaneous tissue: Secondary | ICD-10-CM

## 2023-05-17 DIAGNOSIS — D692 Other nonthrombocytopenic purpura: Secondary | ICD-10-CM

## 2023-05-17 DIAGNOSIS — L819 Disorder of pigmentation, unspecified: Secondary | ICD-10-CM

## 2023-05-17 DIAGNOSIS — L821 Other seborrheic keratosis: Secondary | ICD-10-CM

## 2023-05-17 DIAGNOSIS — L814 Other melanin hyperpigmentation: Secondary | ICD-10-CM

## 2023-05-17 DIAGNOSIS — Z7189 Other specified counseling: Secondary | ICD-10-CM

## 2023-05-17 DIAGNOSIS — Z1283 Encounter for screening for malignant neoplasm of skin: Secondary | ICD-10-CM | POA: Diagnosis not present

## 2023-05-17 DIAGNOSIS — Z86018 Personal history of other benign neoplasm: Secondary | ICD-10-CM

## 2023-05-17 DIAGNOSIS — L409 Psoriasis, unspecified: Secondary | ICD-10-CM

## 2023-05-17 DIAGNOSIS — D229 Melanocytic nevi, unspecified: Secondary | ICD-10-CM

## 2023-05-17 DIAGNOSIS — Z79899 Other long term (current) drug therapy: Secondary | ICD-10-CM

## 2023-05-17 MED ORDER — TRETINOIN 0.025 % EX CREA: TOPICAL_CREAM | Freq: Every day | CUTANEOUS | 11 refills | Status: AC

## 2023-05-17 MED ORDER — KETOCONAZOLE 2 % EX CREA
TOPICAL_CREAM | CUTANEOUS | 1 refills | Status: AC
Start: 2023-05-17 — End: ?

## 2023-05-17 NOTE — Patient Instructions (Signed)

## 2023-05-17 NOTE — Progress Notes (Signed)
Follow-Up Visit   Subjective  Tiffany Miller is a 57 y.o. female who presents for the following: Skin Cancer Screening and Full Body Skin Exam  The patient presents for Total-Body Skin Exam (TBSE) for skin cancer screening and mole check. The patient has spots, moles and lesions to be evaluated, some may be new or changing and the patient may have concern these could be cancer.  The following portions of the chart were reviewed this encounter and updated as appropriate: medications, allergies, medical history  Review of Systems:  No other skin or systemic complaints except as noted in HPI or Assessment and Plan.  Objective  Well appearing patient in no apparent distress; mood and affect are within normal limits.  A full examination was performed including scalp, head, eyes, ears, nose, lips, neck, chest, axillae, abdomen, back, buttocks, bilateral upper extremities, bilateral lower extremities, hands, feet, fingers, toes, fingernails, and toenails. All findings within normal limits unless otherwise noted below.   Relevant physical exam findings are noted in the Assessment and Plan.    Assessment & Plan   SKIN CANCER SCREENING PERFORMED TODAY.  ACTINIC DAMAGE - Chronic condition, secondary to cumulative UV/sun exposure - diffuse scaly erythematous macules with underlying dyspigmentation - Recommend daily broad spectrum sunscreen SPF 30+ to sun-exposed areas, reapply every 2 hours as needed.  - Staying in the shade or wearing long sleeves, sun glasses (UVA+UVB protection) and wide brim hats (4-inch brim around the entire circumference of the hat) are also recommended for sun protection.  - Call for new or changing lesions.  LENTIGINES, SEBORRHEIC KERATOSES, HEMANGIOMAS - Benign normal skin lesions - Benign-appearing - Call for any changes  MELANOCYTIC NEVI - Tan-brown and/or pink-flesh-colored symmetric macules and papules - Benign appearing on exam today -  Observation - Call clinic for new or changing moles - Recommend daily use of broad spectrum spf 30+ sunscreen to sun-exposed areas.   HISTORY OF DYSPLASTIC NEVUS No evidence of recurrence today Recommend regular full body skin exams Recommend daily broad spectrum sunscreen SPF 30+ to sun-exposed areas, reapply every 2 hours as needed.  Call if any new or changing lesions are noted between office visits  PSORIASIS             Exam: Hyperkeratosis of the feet, and thickened skin. 5% BSA.  Chronic and persistent condition with duration or expected duration over one year. Condition is symptomatic/ bothersome to patient. Not currently at goal.  Psoriasis is a chronic non-curable, but treatable genetic/hereditary disease that may have other systemic features affecting other organ systems such as joints (Psoriatic Arthritis). It is associated with an increased risk of inflammatory bowel disease, heart disease, non-alcoholic fatty liver disease, and depression.  Treatments include light and laser treatments; topical medications; and systemic medications including oral and injectables.  Treatment Plan: Sample given Zoryve cream to aa's QD. Consider Vtama if not improving.   POST-INFLAMMATORY HYPERPIGMENTATION (PIH) - possibly from plucking/waxing eyebrows vs TV vs seb derm    Exam: hyperpigmented macules and/or patches at the glabella    This is a benign condition that comes from having previous inflammation in the skin and will fade with time over months to sometimes years. Recommend daily sun protection including sunscreen SPF 30+ to sun-exposed areas. - Recommend treating any itchy or red areas on the skin quickly to prevent new areas of PIH. Treating with prescription medicines such as hydroquinone may help fade dark spots faster.    Treatment Plan: Avoid plucking, wear sunscreen  daily to avoid darkening of areas. Start Ketoconazole 2% cream apply to aa QHS.   Purpura - Chronic;  persistent and recurrent.  Treatable, but not curable. - Violaceous macules and patches - Benign - Related to trauma, age, sun damage and/or use of blood thinners, chronic use of topical and/or oral steroids - Observe - Can use OTC arnica containing moisturizer such as Dermend Bruise Formula if desired - Call for worsening or other concerns  FACIAL ELASTOSIS Exam: Rhytides and volume loss.  Treatment Plan: Continue Tretinoin 0.025% cream QHS.   Recommend daily broad spectrum sunscreen SPF 30+ to sun-exposed areas, reapply every 2 hours as needed. Call for new or changing lesions.  Staying in the shade or wearing long sleeves, sun glasses (UVA+UVB protection) and wide brim hats (4-inch brim around the entire circumference of the hat) are also recommended for sun protection.    Return in about 1 year (around 05/16/2024) for TBSE.  Maylene Roes, CMA, am acting as scribe for [name], MD .   Documentation: I have reviewed the above documentation for accuracy and completeness, and I agree with the above.  [name], MD

## 2023-05-24 ENCOUNTER — Encounter: Payer: Self-pay | Admitting: Dermatology

## 2023-07-06 ENCOUNTER — Telehealth: Payer: Self-pay

## 2023-07-06 NOTE — Telephone Encounter (Signed)
 Patient states that her feet have improved with Zoryve cream, but she has plenty of samples and doesn't need a prescription at this time. Patient has noticed a new lesion on her face and she is concerned about and would like checked. Appointment scheduled with Dr. Claudene.

## 2023-07-06 NOTE — Telephone Encounter (Signed)
-----   Message from [name] sent at 05/24/2023  6:51 PM EST ----- Contact patient and a week to 3 weeks to see if the Tiffany Miller is helping her feet If so you may send that in as an Rx

## 2023-07-20 ENCOUNTER — Ambulatory Visit (INDEPENDENT_AMBULATORY_CARE_PROVIDER_SITE_OTHER): Payer: 59 | Admitting: Dermatology

## 2023-07-20 ENCOUNTER — Encounter: Payer: Self-pay | Admitting: Dermatology

## 2023-07-20 DIAGNOSIS — L821 Other seborrheic keratosis: Secondary | ICD-10-CM | POA: Diagnosis not present

## 2023-07-20 DIAGNOSIS — L308 Other specified dermatitis: Secondary | ICD-10-CM

## 2023-07-20 MED ORDER — HYDROCORTISONE 2.5 % EX OINT: TOPICAL_OINTMENT | CUTANEOUS | 1 refills | Status: AC

## 2023-07-20 NOTE — Patient Instructions (Addendum)
Hydrocortisone 2.5% ointment twice a day until resolved.   Topical steroids (such as triamcinolone, fluocinolone, fluocinonide, mometasone, clobetasol, halobetasol, betamethasone, hydrocortisone) can cause thinning and lightening of the skin if they are used for too long in the same area. Your physician has selected the right strength medicine for your problem and area affected on the body. Please use your medication only as directed by your physician to prevent side effects.     Due to recent changes in healthcare laws, you may see results of your pathology and/or laboratory studies on MyChart before the doctors have had a chance to review them. We understand that in some cases there may be results that are confusing or concerning to you. Please understand that not all results are received at the same time and often the doctors may need to interpret multiple results in order to provide you with the best plan of care or course of treatment. Therefore, we ask that you please give [place] 2 business days to thoroughly review all your results before contacting the office for clarification. Should we see a critical lab result, you will be contacted sooner.   If You Need Anything After Your Visit  If you have any questions or concerns for your doctor, please call our main line at [phone] and press option 4 to reach your doctor's medical assistant. If no one answers, please leave a voicemail as directed and we will return your call as soon as possible. Messages left after 4 pm will be answered the following business day.   You may also send [place] a message via MyChart. We typically respond to MyChart messages within 1-2 business days.  For prescription refills, please ask your pharmacy to contact our office. Our fax number is [phone].  If you have an urgent issue when the clinic is closed that cannot wait until the next business day, you can page your doctor at the number below.    Please note that  while we do our best to be available for urgent issues outside of office hours, we are not available 24/7.   If you have an urgent issue and are unable to reach [place], you may choose to seek medical care at your doctor's office, retail clinic, urgent care center, or emergency room.  If you have a medical emergency, please immediately call 911 or go to the emergency department.  Pager Numbers  - Dr. Gwen Pounds: [phone]  - Dr. Roseanne Reno: [phone]  - Dr. Katrinka Blazing: [phone]   In the event of inclement weather, please call our main line at [phone] for an update on the status of any delays or closures.  Dermatology Medication Tips: Please keep the boxes that topical medications come in in order to help keep track of the instructions about where and how to use these. Pharmacies typically print the medication instructions only on the boxes and not directly on the medication tubes.   If your medication is too expensive, please contact our office at [phone] option 4 or send [place] a message through MyChart.   We are unable to tell what your co-pay for medications will be in advance as this is different depending on your insurance coverage. However, we may be able to find a substitute medication at lower cost or fill out paperwork to get insurance to cover a needed medication.   If a prior authorization is required to get your medication covered by your insurance company, please allow [place] 1-2 business days to complete this process.  Drug prices  often vary depending on where the prescription is filled and some pharmacies may offer cheaper prices.  The website www.goodrx.com contains coupons for medications through different pharmacies. The prices here do not account for what the cost may be with help from insurance (it may be cheaper with your insurance), but the website can give you the price if you did not use any insurance.  - You can print the associated coupon and take it with your  prescription to the pharmacy.  - You may also stop by our office during regular business hours and pick up a GoodRx coupon card.  - If you need your prescription sent electronically to a different pharmacy, notify our office through [facility] or by phone at [phone] option 4.     Si Usted Necesita Algo Despus de Su Visita  Tambin puede enviarnos un mensaje a travs de Clinical cytogeneticist. Por lo general respondemos a los mensajes de MyChart en el transcurso de 1 a 2 das hbiles.  Para renovar recetas, por favor pida a su farmacia que se ponga en contacto con nuestra oficina. Annie Sable de fax es [place] [phone].  Si tiene un asunto urgente cuando la clnica est cerrada y que no puede esperar hasta el siguiente da hbil, puede llamar/localizar a su doctor(a) al nmero que aparece a continuacin.   Por favor, tenga en cuenta que aunque hacemos todo lo posible para estar disponibles para asuntos urgentes fuera del horario de [place], no estamos disponibles las 24 horas del da, los 7 [address]  [address] de la [place].   Si tiene un problema urgente y no puede comunicarse con nosotros, puede optar por buscar atencin mdica  en el consultorio de su doctor(a), en una clnica privada, en un centro de atencin urgente o en una sala de emergencias.  Si tiene Engineer, drilling, por favor llame inmediatamente al 911 o vaya a la sala de emergencias.  Nmeros de bper  - Dr. Gwen Pounds: [phone]  - Dra. Roseanne Reno: [phone]  - Dr. Katrinka Blazing: [phone]   En caso de inclemencias del tiempo, por favor llame a Lacy Duverney principal al [phone] para una actualizacin sobre el [place] de cualquier retraso o cierre.  Consejos para la medicacin en dermatologa: Por favor, guarde las cajas en las que vienen los medicamentos de uso tpico para ayudarle a seguir las instrucciones sobre dnde y cmo usarlos. Las farmacias generalmente imprimen las instrucciones del medicamento slo en las cajas y no  directamente en los tubos del [place].   Si su medicamento es muy caro, por favor, pngase en contacto con Rolm Gala llamando al [phone] y presione la opcin 4 o envenos un mensaje a travs de Clinical cytogeneticist.   No podemos decirle cul ser su copago por los medicamentos por adelantado ya que esto es diferente dependiendo de la cobertura de su seguro. Sin embargo, es posible que podamos encontrar un medicamento sustituto a Audiological scientist un formulario para que el seguro cubra el medicamento que se considera necesario.   Si se requiere una autorizacin previa para que su compaa de seguros [place] su medicamento, por favor permtanos de 1 a 2 das hbiles para completar [address]  Los precios de los medicamentos varan con frecuencia dependiendo del Environmental consultant de dnde se surte la receta y alguna farmacias pueden ofrecer precios ms baratos.  El sitio web www.goodrx.com tiene cupones para medicamentos de Health and safety inspector. Los precios aqu no tienen en cuenta lo que podra costar con la ayuda del seguro (puede ser ms barato  con su seguro), pero el sitio web puede darle el precio si no Visual merchandiser.  - Puede imprimir el cupn correspondiente y llevarlo con su receta a la farmacia.  - Tambin puede pasar por nuestra oficina durante el horario de atencin regular y Education officer, museum una tarjeta de cupones de GoodRx.  - Si necesita que su receta se enve electrnicamente a una farmacia diferente, informe a nuestra oficina a travs de MyChart de [place] o por telfono llamando al [phone] y presione la opcin 4.

## 2023-07-20 NOTE — Progress Notes (Signed)
   Follow-Up Visit   Subjective  Tiffany Miller is a 58 y.o. female who presents for the following: Spot on left side of face. Very tiny. Itches. Dur: ~1 month.  Check spot on left thumb. Getting larger. Thought was a wound. Will not go away. Tender, does not itch. Dur: 1 1/2 weeks. She states she may have had a small wound in the skin   The patient has spots, moles and lesions to be evaluated, some may be new or changing and the patient may have concern these could be cancer.    The following portions of the chart were reviewed this encounter and updated as appropriate: medications, allergies, medical history  Review of Systems:  No other skin or systemic complaints except as noted in HPI or Assessment and Plan.  Objective  Well appearing patient in no apparent distress; mood and affect are within normal limits.  A focused examination was performed of the following areas: Face  Relevant physical exam findings are noted in the Assessment and Plan.             Assessment & Plan   SEBORRHEIC KERATOSES   OTHER ECZEMA    Idiopathic eczematous dermatitis vs porokeratosis vs ISK vs other  Exam: well-demarcated scaly pink patch at left first MCP  Treatment Plan: Hydrocortisone 2.5% ointment twice a day until resolved.  Topical steroids (such as triamcinolone, fluocinolone, fluocinonide, mometasone, clobetasol, halobetasol, betamethasone, hydrocortisone) can cause thinning and lightening of the skin if they are used for too long in the same area. Your physician has selected the right strength medicine for your problem and area affected on the body. Please use your medication only as directed by your physician to prevent side effects.    Early SEBORRHEIC KERATOSIS vs nevus other - Small skin-coloured papule with dark brown pigment globules at left temporal hair line  - Benign-appearing - Discussed benign etiology and prognosis. - Observe - Call for any  changes      Return in about 4 weeks (around 08/17/2023) for Lesion follow up.  I, Lawson Radar, CMA, am acting as scribe for Elie Goody, MD.   Documentation: I have reviewed the above documentation for accuracy and completeness, and I agree with the above.  Elie Goody, MD

## 2023-07-23 ENCOUNTER — Encounter: Payer: Self-pay | Admitting: Dermatology

## 2023-08-29 ENCOUNTER — Ambulatory Visit: Payer: 59 | Admitting: Dermatology

## 2024-05-13 ENCOUNTER — Ambulatory Visit: Admitting: Dermatology

## 2024-05-16 ENCOUNTER — Ambulatory Visit: Payer: 59 | Admitting: Dermatology

## 2024-06-24 ENCOUNTER — Ambulatory Visit: Admitting: Dermatology

## 2024-06-24 DIAGNOSIS — L814 Other melanin hyperpigmentation: Secondary | ICD-10-CM

## 2024-06-24 DIAGNOSIS — D229 Melanocytic nevi, unspecified: Secondary | ICD-10-CM

## 2024-06-24 DIAGNOSIS — L578 Other skin changes due to chronic exposure to nonionizing radiation: Secondary | ICD-10-CM

## 2024-06-24 DIAGNOSIS — L409 Psoriasis, unspecified: Secondary | ICD-10-CM

## 2024-06-24 DIAGNOSIS — L821 Other seborrheic keratosis: Secondary | ICD-10-CM | POA: Diagnosis not present

## 2024-06-24 DIAGNOSIS — D1801 Hemangioma of skin and subcutaneous tissue: Secondary | ICD-10-CM

## 2024-06-24 DIAGNOSIS — L988 Other specified disorders of the skin and subcutaneous tissue: Secondary | ICD-10-CM | POA: Diagnosis not present

## 2024-06-24 DIAGNOSIS — W908XXA Exposure to other nonionizing radiation, initial encounter: Secondary | ICD-10-CM | POA: Diagnosis not present

## 2024-06-24 DIAGNOSIS — Z86018 Personal history of other benign neoplasm: Secondary | ICD-10-CM | POA: Diagnosis not present

## 2024-06-24 DIAGNOSIS — Z7189 Other specified counseling: Secondary | ICD-10-CM

## 2024-06-24 DIAGNOSIS — Z79899 Other long term (current) drug therapy: Secondary | ICD-10-CM

## 2024-06-24 DIAGNOSIS — Z1283 Encounter for screening for malignant neoplasm of skin: Secondary | ICD-10-CM

## 2024-06-24 DIAGNOSIS — K130 Diseases of lips: Secondary | ICD-10-CM

## 2024-06-24 MED ORDER — TACROLIMUS 0.1 % EX OINT: TOPICAL_OINTMENT | Freq: Two times a day (BID) | CUTANEOUS | 0 refills | Status: AC

## 2024-06-24 MED ORDER — TRETINOIN 0.05 % EX CREA: TOPICAL_CREAM | Freq: Every day | CUTANEOUS | 6 refills | Status: AC

## 2024-06-24 NOTE — Patient Instructions (Addendum)

## 2024-06-24 NOTE — Progress Notes (Unsigned)
 "  Follow-Up Visit   Subjective  Tiffany Miller is a 58 y.o. female who presents for the following: Skin Cancer Screening and Full Body Skin Exam, hx of Dysplastic nevus.  Patient report she was recently diagnosed with Psoriatic arthritis. Patient c/o rash on her lips that will come and go for ~ 3 weeks, tried otc Vaseline no help, burn lips.  Mother with a hx of Psoriasis,   The patient presents for Total-Body Skin Exam (TBSE) for skin cancer screening and mole check. The patient has spots, moles and lesions to be evaluated, some may be new or changing and the patient may have concern these could be cancer.  The following portions of the chart were reviewed this encounter and updated as appropriate: medications, allergies, medical history  Review of Systems:  No other skin or systemic complaints except as noted in HPI or Assessment and Plan.  Objective  Well appearing patient in no apparent distress; mood and affect are within normal limits.  A full examination was performed including scalp, head, eyes, ears, nose, lips, neck, chest, axillae, abdomen, back, buttocks, bilateral upper extremities, bilateral lower extremities, hands, feet, fingers, toes, fingernails, and toenails. All findings within normal limits unless otherwise noted below.   Relevant physical exam findings are noted in the Assessment and Plan.    Assessment & Plan   SKIN CANCER SCREENING PERFORMED TODAY.  ACTINIC DAMAGE - Chronic condition, secondary to cumulative UV/sun exposure - diffuse scaly erythematous macules with underlying dyspigmentation - Recommend daily broad spectrum sunscreen SPF 30+ to sun-exposed areas, reapply every 2 hours as needed.  - Staying in the shade or wearing long sleeves, sun glasses (UVA+UVB protection) and wide brim hats (4-inch brim around the entire circumference of the hat) are also recommended for sun protection.  - Call for new or changing lesions.  LENTIGINES, SEBORRHEIC  KERATOSES, HEMANGIOMAS - Benign normal skin lesions - Benign-appearing - Call for any changes  MELANOCYTIC NEVI - Tan-brown and/or pink-flesh-colored symmetric macules and papules - Benign appearing on exam today - Observation - Call clinic for new or changing moles - Recommend daily use of broad spectrum spf 30+ sunscreen to sun-exposed areas.   PSORIASIS  -involving feet and toenails  rheumatologist feels like patient may have psoriatic arthritis. Positive family history of psoriasis significant in mother. Exam: Hyperkeratosis of the feet, and thickened skin.  Some nail changes of the toenails. Chronic and persistent condition with duration or expected duration over one year. Condition is symptomatic/ bothersome to patient. Not currently at goal. Psoriasis is a chronic non-curable, but treatable genetic/hereditary disease that may have other systemic features affecting other organ systems such as joints (Psoriatic Arthritis). It is associated with an increased risk of inflammatory bowel disease, heart disease, non-alcoholic fatty liver disease, and depression.  Treatments include light and laser treatments; topical medications; and systemic medications including oral and injectables. TX: Zoryve cream prn Follow-up with rheumatology for psoriatic arthritis and treatment for that.   DERMATITIS /cheilitis Lips- mainly clear today  Recommend patch testing in the future if not improving Start Tacrolimus  ointment apply to lips daily     HISTORY OF DYSPLASTIC NEVUS No evidence of recurrence today Recommend regular full body skin exams Recommend daily broad spectrum sunscreen SPF 30+ to sun-exposed areas, reapply every 2 hours as needed.  Call if any new or changing lesions are noted between office visits   FACIAL ELASTOSIS Exam: Rhytides and volume loss- upper lip  Treatment Plan:  Increase Tretinoin  0.5% cream  apply to face at bedtime   Botox (1 u x 4 of upper lip) or filler may  help or laser (Halo or Fraxel?)with Dr Sherre discussed   Patient will call her to schedule Botox appointment if Tretinoin  is not help.    Recommend daily broad spectrum sunscreen SPF 30+ to sun-exposed areas, reapply every 2 hours as needed. Call for new or changing lesions.  Staying in the shade or wearing long sleeves, sun glasses (UVA+UVB protection) and wide brim hats (4-inch brim around the entire circumference of the hat) are also recommended for sun protection.    Return in about 1 year (around 06/24/2025) for TBSE, hx of Dysplastic nevus .  IFay Kirks, CMA, am acting as scribe for Alm Rhyme, MD .   Documentation: I have reviewed the above documentation for accuracy and completeness, and I agree with the above.  Alm Rhyme, MD      "

## 2024-06-25 ENCOUNTER — Encounter: Payer: Self-pay | Admitting: Dermatology

## 2024-07-08 ENCOUNTER — Encounter

## 2024-07-18 ENCOUNTER — Ambulatory Visit

## 2024-07-18 DIAGNOSIS — Z1211 Encounter for screening for malignant neoplasm of colon: Secondary | ICD-10-CM | POA: Diagnosis present

## 2024-07-18 DIAGNOSIS — K5730 Diverticulosis of large intestine without perforation or abscess without bleeding: Secondary | ICD-10-CM | POA: Diagnosis not present

## 2024-07-18 DIAGNOSIS — K640 First degree hemorrhoids: Secondary | ICD-10-CM | POA: Diagnosis not present

## 2025-07-08 ENCOUNTER — Ambulatory Visit: Admitting: Dermatology
